# Patient Record
Sex: Female | Born: 1970 | Race: Black or African American | Hispanic: Yes | Smoking: Never smoker
Health system: Southern US, Community
[De-identification: ages and names within clinical notes are randomized; demographics above are authoritative.]

## PROBLEM LIST (undated history)

## (undated) DIAGNOSIS — O99345 Other mental disorders complicating the puerperium: Secondary | ICD-10-CM

## (undated) DIAGNOSIS — R002 Palpitations: Secondary | ICD-10-CM

## (undated) DIAGNOSIS — IMO0002 Reserved for concepts with insufficient information to code with codable children: Secondary | ICD-10-CM

## (undated) DIAGNOSIS — M419 Scoliosis, unspecified: Secondary | ICD-10-CM

## (undated) DIAGNOSIS — M1990 Unspecified osteoarthritis, unspecified site: Secondary | ICD-10-CM

## (undated) DIAGNOSIS — B3731 Acute candidiasis of vulva and vagina: Secondary | ICD-10-CM

## (undated) DIAGNOSIS — Z9189 Other specified personal risk factors, not elsewhere classified: Secondary | ICD-10-CM

## (undated) DIAGNOSIS — G43909 Migraine, unspecified, not intractable, without status migrainosus: Secondary | ICD-10-CM

## (undated) DIAGNOSIS — F419 Anxiety disorder, unspecified: Secondary | ICD-10-CM

## (undated) DIAGNOSIS — N930 Postcoital and contact bleeding: Secondary | ICD-10-CM

## (undated) DIAGNOSIS — R102 Pelvic and perineal pain: Secondary | ICD-10-CM

## (undated) DIAGNOSIS — F53 Postpartum depression: Secondary | ICD-10-CM

## (undated) DIAGNOSIS — B373 Candidiasis of vulva and vagina: Secondary | ICD-10-CM

## (undated) HISTORY — DX: Postcoital and contact bleeding: N93.0

## (undated) HISTORY — DX: Candidiasis of vulva and vagina: B37.3

## (undated) HISTORY — DX: Unspecified osteoarthritis, unspecified site: M19.90

## (undated) HISTORY — DX: Migraine, unspecified, not intractable, without status migrainosus: G43.909

## (undated) HISTORY — DX: Other specified personal risk factors, not elsewhere classified: Z91.89

## (undated) HISTORY — DX: Pelvic and perineal pain: R10.2

## (undated) HISTORY — PX: COSMETIC SURGERY: SHX468

## (undated) HISTORY — DX: Anxiety disorder, unspecified: F41.9

## (undated) HISTORY — DX: Postpartum depression: F53.0

## (undated) HISTORY — DX: Palpitations: R00.2

## (undated) HISTORY — DX: Reserved for concepts with insufficient information to code with codable children: IMO0002

## (undated) HISTORY — DX: Acute candidiasis of vulva and vagina: B37.31

## (undated) HISTORY — PX: TUBAL LIGATION: SHX77

## (undated) HISTORY — DX: Scoliosis, unspecified: M41.9

## (undated) HISTORY — DX: Other mental disorders complicating the puerperium: O99.345

---

## 2002-12-20 DIAGNOSIS — IMO0002 Reserved for concepts with insufficient information to code with codable children: Secondary | ICD-10-CM

## 2002-12-20 DIAGNOSIS — R87619 Unspecified abnormal cytological findings in specimens from cervix uteri: Secondary | ICD-10-CM

## 2002-12-20 HISTORY — DX: Reserved for concepts with insufficient information to code with codable children: IMO0002

## 2002-12-20 HISTORY — DX: Unspecified abnormal cytological findings in specimens from cervix uteri: R87.619

## 2003-07-12 ENCOUNTER — Ambulatory Visit (HOSPITAL_COMMUNITY): Admission: RE | Admit: 2003-07-12 | Discharge: 2003-07-12 | Payer: Self-pay | Admitting: Family Medicine

## 2003-07-12 ENCOUNTER — Encounter: Payer: Self-pay | Admitting: Family Medicine

## 2003-10-22 ENCOUNTER — Ambulatory Visit (HOSPITAL_COMMUNITY): Admission: RE | Admit: 2003-10-22 | Discharge: 2003-10-22 | Payer: Self-pay

## 2005-04-14 ENCOUNTER — Ambulatory Visit (HOSPITAL_COMMUNITY): Admission: AD | Admit: 2005-04-14 | Discharge: 2005-04-14 | Payer: Self-pay | Admitting: Obstetrics and Gynecology

## 2006-08-30 ENCOUNTER — Inpatient Hospital Stay (HOSPITAL_COMMUNITY): Admission: AD | Admit: 2006-08-30 | Discharge: 2006-08-30 | Payer: Self-pay | Admitting: Obstetrics and Gynecology

## 2006-09-30 ENCOUNTER — Encounter (INDEPENDENT_AMBULATORY_CARE_PROVIDER_SITE_OTHER): Payer: Self-pay | Admitting: Specialist

## 2006-09-30 ENCOUNTER — Inpatient Hospital Stay (HOSPITAL_COMMUNITY): Admission: AD | Admit: 2006-09-30 | Discharge: 2006-10-03 | Payer: Self-pay | Admitting: Obstetrics and Gynecology

## 2008-06-30 ENCOUNTER — Emergency Department (HOSPITAL_COMMUNITY): Admission: EM | Admit: 2008-06-30 | Discharge: 2008-06-30 | Payer: Self-pay | Admitting: Family Medicine

## 2008-11-15 ENCOUNTER — Encounter: Admission: RE | Admit: 2008-11-15 | Discharge: 2008-11-15 | Payer: Self-pay | Admitting: Family Medicine

## 2008-11-27 ENCOUNTER — Encounter: Admission: RE | Admit: 2008-11-27 | Discharge: 2008-11-27 | Payer: Self-pay | Admitting: Family Medicine

## 2011-05-07 NOTE — Discharge Summary (Signed)
NAMEGLYNN, FREAS              ACCOUNT NO.:  [id number]   MEDICAL RECORD NO.:  [id number]          PATIENT TYPE:  INP   LOCATION:  9139                          FACILITY:  [facility]   PHYSICIAN:  Crist Fat. Rivard, M.D. DATE OF BIRTH:  [birth year]   DATE OF ADMISSION:  09/30/2006  DATE OF DISCHARGE:  10/03/2006                                 DISCHARGE SUMMARY   ADMISSION DIAGNOSES:  1. Intrauterine pregnancy at 38 weeks.  2. Desires repeat cesarean section and tubal sterilization.   DISCHARGE DIAGNOSES:  1. Intrauterine pregnancy at 38 weeks.  2. Desires repeat cesarean section and tubal sterilization.  3. Pelvic adhesions.   HOSPITAL COURSE:  Ms. [name] is a 40 year old, G3, P2-0-0-2 who presented at  62 weeks' for scheduled repeat cesarean section and bilateral tubal  sterilization.  Her pregnancy had been remarkable for transfer of care at 30  weeks, history of previous cesarean section x2 with desire for repeat,  desires tubal sterilization, history of postpartum depression, history of  UTIs in the past, positive Group B Streptococcus.   On the day of admission, the patient was taken to the operating room where  Dr. Jaymes Graff performed a repeat low transverse cesarean section with  tubal sterilization.  Findings were a viable female, weight 8 pounds 7 ounces,  Apgar's 9 and 9.  The infant was taken to the full-term nursery.  The  patient was taken to recovery in good condition.  By postop day #1, the  patient was doing well.  Her hemoglobin was 10.2 down from 11.5  preoperatively.  White blood cell count was 9.4 and platelet count was 302.  Her physical exam was within normal limits.  Her incision was clean, dry and  intact with subcuticular sutures.  The rest of her hospital stay was  essentially uncomplicated.  She was breast-feeding and that was going well.  She was up ad lib and tolerating a regular diet.  She was deemed to receive  full benefit of her hospital stay and  was discharged home.   SPECIAL INSTRUCTIONS:  Discharge instructions per [facility]  Handout.   DISCHARGE MEDICATIONS:  1. Motrin 600 mg p.o. q.6 h. p.r.n. pain.  2. Percocet 5/325 one to two p.o. q.3-4 h. p.r.n. pain.  3. Prenatal vitamin one p.o. daily.   FOLLOW UP:  Discharge followup will occur in 6 weeks at [facility]  OB/GYN or p.r.n.      Renaldo Reel Emilee Hero, C.N.M.      Crist Fat Rivard, M.D.  Electronically Signed    VLL/MEDQ  D:  10/03/2006  T:  10/04/2006  Job:  [id number]

## 2011-05-07 NOTE — H&P (Signed)
NAMEGILLIAN, [name]              ACCOUNT NO.:  [id number]   MEDICAL RECORD NO.:  [id number]          PATIENT TYPE:  INP   LOCATION:  9139                          [facility]  [facility]   PHYSICIAN:  Naima A. Dillard, M.D. DATE OF BIRTH:  [birth year]   DATE OF ADMISSION:  09/30/2006  DATE OF DISCHARGE:                                HISTORY & PHYSICAL   CHIEF COMPLAINT:  Pregnancy at term, desires repeat cesarean section.  The  patient is a 40 year old gravida 3, para 2, whose last menstrual period was  January 06, 2006 and estimated due date is October 13, 2006.  Is presenting  for repeat cesarean section and tubal ligation.  She denies any contraction,  denies any vaginal bleeding or rupture of membranes, has positive fetal  activity.   REVIEW OF SYSTEMS:  The patient denies having any heart palpitations, any  history of irritable bowel syndrome.  gyn history significant for a history  of abnormal Pap smear.  Endocrine is unremarkable for diabetes or high blood  pressure.   Prenatal course was at [facility].  She is a transfer for her care at 30 weeks.   MEDICATIONS:  Prenatal vitamins.   ALLERGIES:  No known drug allergies.   PAST OB HISTORY:  Previous cesarean section x 2, one in September 2003 at 39  weeks for nonreassuring fetal heart tones with failure to progress and one  in June of 2006 secondary to being breech.   GYN HISTORY:  Patient has a history of abnormal Pap smear in 2004.  Denies  history of any sexually transmitted diseases.   PAST MEDICAL HISTORY:  Significant for postpartum depression.   PAST SURGICAL HISTORY:  Significant for cesarean section x 2.   FAMILY HISTORY:  The patient is married.  She has a half sister with a  history of Turner Syndrome.  Maternal aunt with hypertension.   PRENATAL LABS:  She is O positive, antibody negative.  Last hemoglobin was  12.12, platelet count 315,000.  She is rubella immune, hepatitis B surface  antigen is negative. Syphilis  is nonreactive.  HIV negative. Gonorrhea and  Chlamydia both negative.  One-hour Glucola was abnormal at 160 but three  hour Glucola was within normal limits.   SOCIAL HISTORY:  The patient denies any tobacco or alcohol use.   PHYSICAL EXAMINATION:  VITAL SIGNS:  Patient weighs 158 pounds, fundal  height is 39.  She is 37 and 3/7 weeks.  Fetal heart tones 150.  Blood  pressure 110/60.  Patient is in no apparent distress.  HEART:  Regular rate and rhythm.  LUNGS:  Clear to auscultation bilaterally.  ABDOMEN:  Gravid, soft, nontender.  NECK:  Thyroid is not enlarged.  vulvar vaginal exam was within normal limits.  On last exam the patient was  closed, thick and -3.  EXTREMITIES:  No cyanosis, clubbing, edema or calf tenderness.   ASSESSMENT:  Pregnancy at 38 weeks, desires repeat cesarean section and  tubal ligation.   PLAN:  Repeat cesarean section and tubal ligation.  Patient understands the  risks including not limited to bleeding, infection, damage  to internal  organs such as bowel, bladder  and major blood vessels      Naima A. Normand Sloop, M.D.  Electronically Signed     NAD/MEDQ  D:  09/29/2006  T:  09/30/2006  Job:  [id number]

## 2011-05-07 NOTE — Op Note (Signed)
[name], [name]              ACCOUNT NO.:  [id number]   MEDICAL RECORD NO.:  [id number]          PATIENT TYPE:  INP   LOCATION:  9139                          [facility]ILITY[facility]  [facility]   PHYSICIAN:  Naima A. Dillard, M.D. DATE OF BIRTH:  26-Dec-1970   DATE OF PROCEDURE:  09/30/2006  DATE OF DISCHARGE:                                 OPERATIVE REPORT   PREOPERATIVE DIAGNOSES:  1. Term, desires repeat cesarean section.  2. Multiparity, desires tubal ligation.   POSTOPERATIVE DIAGNOSES:  1. Term, desires repeat cesarean section.  2. Multiparity, desires tubal ligation.  3. Abdominopelvic adhesions.   OPERATION/PROCEDURE:  1. Repeat cesarean section.  2. Bilateral tubal ligation.  3. Lysis of adhesions.   SURGEON:  Naima A. Normand Sloop, M.D.   ASSISTANT:  Elby Showers. Williams, C.N.M.   ANESTHESIA:  Spinal.   SPECIMENS:  Placenta to labor and delivery for disposal.   ESTIMATED BLOOD LOSS:  800 mL.   IV FLUIDS:  3400 mL crystalloid.   URINARY OUTPUT:  150 mL clear urine.   COMPLICATIONS:  None.   FINDINGS:  Female infant, vertex presentation.  Clear fluid.  Nuchal cord x1  was easily reduced.  Apgars 9 and 9.  Weight was 8 pounds 5 ounces.   RISKS:  The risks could be not limited to bleeding, infection, damage to  internal organs such as bowel, bladder, major blood vessels, and even a  failure in the tubal ligation being 1 on 200 which could 50% could be result  in ectopic pregnancy.   DESCRIPTION OF PROCEDURE:  The patient was taken to the operating room and  given spinal anesthesia, placed in the dorsal supine position with a left  lateral tilt.  Pfannenstiel skin incision was made along her previous  incision and carried down to the fascia.  The fascia was incised in the  midline, extended bilaterally.  Kochers x2 were placed in the superior  aspect of the fascia which was dissected off  both sharply and bluntly.  The  inferior aspect of the fascia was dissected off the rectus  muscle in a  similar fashion.  The rectus muscle was separated in the midline.  What  appeared to be peritoneum was identified, tented up and entered sharply.  There were bladder and uterine adhesions which were dissected and it was  very difficult to make the bladder flap.  So I made a lower transverse  uterine incision just above the bladder and extended bilaterally using  bandage scissors.  The sac was entered bluntly and clear fluid was noted.  Tried to deliver the infant's head several times and could not use the  vacuum in the right position.  There were three pulls and three pop-offs in  the green safe zone. I cut the patient's left rectus muscle and we still  applied pressure on the head, finally delivered.  Mouth and nose were bulb  suctioned.  Nuchal cord was easily reduced.  Body was delivered without  difficulty.  Cord was clamped and cut.  The infant was handed to the waiting  pediatricians.  The placenta was manually  delivered.  The uterus was cleared  of all clot and debris.  Uterine incision was repaired with 0 Vicryl.  A  second layer of 0 Vicryl was used to obtain hemostasis.  There was still  some bleeding on the left side of the uterus.  This was made hemostatic with  figure-of-eight stitch.  The uterus was adherent to the anterior abdominal  wall.  The adhesions were then lysed both sharply and bluntly and the  patient's left fallopian tube was found, ligated with 2-0 plain and a  portion was excised and sent to pathology.  Hemostasis was assured.  The  patient's right fallopian tube was found, grasped with a Babcock clamp,  ligated with 2-0 plain and excised.  Hemostasis was assured. Tubes were  returned back Any areas that were bleeding from dissection were made  hemostatic using Bovie cautery.  The muscle that was cut was put together  using 0 Vicryl.  Any bleeding areas were made hemostatic with 0 Vicryl.  There were some just oozing areas from where I lysed the  adhesions and most  were made hemostatic using Bovie cautery, but I also used Surgicel just over  the uterus and underneath the rectus muscle.  There were no other bleeding  areas noted.  Irrigation was then done again.  The fascia was closed with 0  Vicryl on a running fashion.  The skin was closed with 3-0 Monocryl in  subcuticular fashion.  Sponge, lap and needle counts were correct.  The  patient went to the recovery room in stable condition.      Naima A. Normand Sloop, M.D.  Electronically Signed     NAD/MEDQ  D:  09/30/2006  T:  10/02/2006  Job:  [id number]

## 2011-09-16 LAB — POCT I-STAT, CHEM 8
BUN: 8
Calcium, Ion: 1.24
Chloride: 107
Creatinine, Ser: 1
Glucose, Bld: 83
HCT: 37
Hemoglobin: 12.6
Potassium: 3.8
Sodium: 140
TCO2: 24

## 2011-09-16 LAB — RAPID URINE DRUG SCREEN, HOSP PERFORMED
Amphetamines: NOT DETECTED
Barbiturates: NOT DETECTED
Benzodiazepines: NOT DETECTED
Cocaine: NOT DETECTED
Opiates: NOT DETECTED
Tetrahydrocannabinol: NOT DETECTED

## 2011-09-16 LAB — POCT PREGNANCY, URINE
Operator id: 284251
Preg Test, Ur: NEGATIVE

## 2011-09-16 LAB — TSH: TSH: 1.725

## 2012-08-08 ENCOUNTER — Encounter: Payer: Self-pay | Admitting: Obstetrics and Gynecology

## 2012-08-22 ENCOUNTER — Ambulatory Visit: Payer: Self-pay | Admitting: Obstetrics and Gynecology

## 2012-09-05 ENCOUNTER — Ambulatory Visit (INDEPENDENT_AMBULATORY_CARE_PROVIDER_SITE_OTHER): Payer: PRIVATE HEALTH INSURANCE | Admitting: Obstetrics and Gynecology

## 2012-09-05 VITALS — BP 110/60 | Ht 63.0 in | Wt 137.0 lb

## 2012-09-05 DIAGNOSIS — D259 Leiomyoma of uterus, unspecified: Secondary | ICD-10-CM

## 2012-09-05 DIAGNOSIS — D219 Benign neoplasm of connective and other soft tissue, unspecified: Secondary | ICD-10-CM

## 2012-09-05 NOTE — Progress Notes (Signed)
Pt here to f/u on fibroids no complaints at this time pt just wants to make sure everything is ok.  She occasionally has cramping with mense or with IC BP 110/60  Ht 5\' 3"  (1.6 m)  Wt 137 lb (62.143 kg)  BMI 24.27 kg/m2  LMP 08/31/2012 Physical Examination: General appearance - alert, well appearing, and in no distress Heart - normal rate and regular rhythm Abdomen - soft, nontender, nondistended, no masses or organomegaly Pelvic - normal external genitalia, vulva, vagina, cervix, uterus and adnexa Fibroid uterus Pt stable and desires observation

## 2012-09-05 NOTE — Patient Instructions (Signed)
Fibroids You have been diagnosed as having a fibroid. Fibroids are smooth muscle lumps (tumors) which can occur any place in a woman's body. They are usually in the womb (uterus). The most common problem (symptom) of fibroids is bleeding. Over time this may cause low red blood cells (anemia). Other symptoms include feelings of pressure and pain in the pelvis. The diagnosis (learning what is wrong) of fibroids is made by physical exam. Sometimes tests such as an ultrasound are used. This is helpful when fibroids are felt around the ovaries and to look for tumors. TREATMENT   Most fibroids do not need surgical or medical treatment. Sometimes a tissue sample (biopsy) of the lining of the uterus is done to rule out cancer. If there is no cancer and only a small amount of bleeding, the problem can be watched.   Hormonal treatment can improve the problem.   When surgery is needed, it can consist of removing the fibroid. Vaginal birth may not be possible after the removal of fibroids. This depends on where they are and the extent of surgery. When pregnancy occurs with fibroids it is usually normal.   Your caregiver can help decide which treatments are best for you.  HOME CARE INSTRUCTIONS   Do not use aspirin as this may increase bleeding problems.   If your periods (menses) are heavy, record the number of pads or tampons used per month. Bring this information to your caregiver. This can help them determine the best treatment for you.  SEEK IMMEDIATE MEDICAL CARE IF:  You have pelvic pain or cramps not controlled with medications, or experience a sudden increase in pain.   You have an increase of pelvic bleeding between and during menses.   You feel lightheaded or have fainting spells.   You develop worsening belly (abdominal) pain.  Document Released: 12/03/2000 Document Revised: 11/25/2011 Document Reviewed: 07/25/2008 Exit[employer] Patient Information 2012 [employer].    

## 2012-10-26 ENCOUNTER — Encounter: Payer: Self-pay | Admitting: Obstetrics and Gynecology

## 2012-10-26 ENCOUNTER — Ambulatory Visit (INDEPENDENT_AMBULATORY_CARE_PROVIDER_SITE_OTHER): Payer: PRIVATE HEALTH INSURANCE | Admitting: Obstetrics and Gynecology

## 2012-10-26 VITALS — BP 110/60 | Ht 63.0 in | Wt 137.0 lb

## 2012-10-26 DIAGNOSIS — Z124 Encounter for screening for malignant neoplasm of cervix: Secondary | ICD-10-CM

## 2012-10-26 NOTE — Progress Notes (Signed)
Last Pap: 04/2011 WNL: Yes Regular Periods:yes Contraception: n/a  Monthly Breast exam:yes Tetanus<83yrs:yes Nl.Bladder Function:yes Daily BMs:yes Healthy Diet:yes Calcium:yes Mammogram:no Date of Mammogram: 2009 wnl per pt Exercise:yes Have often Exercise: occ Seatbelt: yes Abuse at home: no Stressful work:no Sigmoid-colonoscopy: n/a Bone Density: No PCP: DR.[place]amak  Change in PMH: no change Change in FMH:no change BP 110/60  Ht 5\' 3"  (1.6 m)  Wt 137 lb (62.143 kg)  BMI 24.27 kg/m2  LMP 10/22/2012 Pt with complaints:no Physical Examination: General appearance - alert, well appearing, and in no distress Mental status - normal mood, behavior, speech, dress, motor activity, and thought processes Neck - supple, no significant adenopathy,  thyroid exam: thyroid is normal in size without nodules or tenderness Chest - clear to auscultation, no wheezes, rales or rhonchi, symmetric air entry Heart - normal rate and regular rhythm Abdomen - soft, nontender, nondistended, no masses or organomegaly Breasts - breasts appear normal, no suspicious masses, no skin or nipple changes or axillary nodes Pelvic - normal external genitalia, vulva, vagina, cervix, uterus and adnexa Rectal - rectal exam not indicated Back exam - full range of motion, no tenderness, palpable spasm or pain on motion Neurological - alert, oriented, normal speech, no focal findings or movement disorder noted Musculoskeletal - no joint tenderness, deformity or swelling Extremities - no edema, redness or tenderness in the calves or thighs Skin - normal coloration and turgor, no rashes, no suspicious skin lesions noted Routine exam Pap sent yes Mammogram due yes in 12/2012.  The pt will call and scheudle BTL used for contraception RT 1 yr

## 2012-10-30 LAB — PAP IG W/ RFLX HPV ASCU

## 2014-01-22 ENCOUNTER — Other Ambulatory Visit: Payer: Self-pay

## 2014-01-22 DIAGNOSIS — Z1231 Encounter for screening mammogram for malignant neoplasm of breast: Secondary | ICD-10-CM

## 2014-02-07 ENCOUNTER — Ambulatory Visit
Admission: RE | Admit: 2014-02-07 | Discharge: 2014-02-07 | Disposition: A | Discharge status: Home / Self Care | Payer: 59 | Source: Ambulatory Visit

## 2014-02-07 DIAGNOSIS — Z1231 Encounter for screening mammogram for malignant neoplasm of breast: Secondary | ICD-10-CM

## 2014-02-13 ENCOUNTER — Other Ambulatory Visit: Payer: Self-pay | Admitting: Obstetrics and Gynecology

## 2014-02-13 DIAGNOSIS — R928 Other abnormal and inconclusive findings on diagnostic imaging of breast: Secondary | ICD-10-CM

## 2014-02-14 ENCOUNTER — Ambulatory Visit
Admission: RE | Admit: 2014-02-14 | Discharge: 2014-02-14 | Disposition: A | Discharge status: Home / Self Care | Payer: 59 | Source: Ambulatory Visit | Attending: Obstetrics and Gynecology | Admitting: Obstetrics and Gynecology

## 2014-02-14 ENCOUNTER — Other Ambulatory Visit: Payer: Self-pay | Admitting: Obstetrics and Gynecology

## 2014-02-14 DIAGNOSIS — R928 Other abnormal and inconclusive findings on diagnostic imaging of breast: Secondary | ICD-10-CM

## 2014-02-20 ENCOUNTER — Other Ambulatory Visit: Payer: Self-pay | Admitting: Obstetrics and Gynecology

## 2014-02-20 ENCOUNTER — Ambulatory Visit
Admission: RE | Admit: 2014-02-20 | Discharge: 2014-02-20 | Disposition: A | Discharge status: Home / Self Care | Payer: 59 | Source: Ambulatory Visit | Attending: Obstetrics and Gynecology | Admitting: Obstetrics and Gynecology

## 2014-02-20 DIAGNOSIS — N6310 Unspecified lump in the right breast, unspecified quadrant: Secondary | ICD-10-CM

## 2014-02-20 DIAGNOSIS — R928 Other abnormal and inconclusive findings on diagnostic imaging of breast: Secondary | ICD-10-CM

## 2014-02-27 ENCOUNTER — Other Ambulatory Visit: Payer: 59

## 2014-08-29 ENCOUNTER — Ambulatory Visit (INDEPENDENT_AMBULATORY_CARE_PROVIDER_SITE_OTHER): Payer: 59 | Admitting: Family Medicine

## 2014-08-29 ENCOUNTER — Encounter: Payer: Self-pay | Admitting: Family Medicine

## 2014-08-29 VITALS — BP 94/60 | HR 105 | Temp 98.3°F | Ht 62.75 in | Wt 134.0 lb

## 2014-08-29 DIAGNOSIS — N912 Amenorrhea, unspecified: Secondary | ICD-10-CM | POA: Insufficient documentation

## 2014-08-29 DIAGNOSIS — Z7689 Persons encountering health services in other specified circumstances: Secondary | ICD-10-CM

## 2014-08-29 DIAGNOSIS — G43019 Migraine without aura, intractable, without status migrainosus: Secondary | ICD-10-CM

## 2014-08-29 DIAGNOSIS — G43909 Migraine, unspecified, not intractable, without status migrainosus: Secondary | ICD-10-CM | POA: Insufficient documentation

## 2014-08-29 DIAGNOSIS — Z7189 Other specified counseling: Secondary | ICD-10-CM

## 2014-08-29 LAB — POCT URINE PREGNANCY: Preg Test, Ur: NEGATIVE

## 2014-08-29 NOTE — Progress Notes (Signed)
No chief complaint on file.   HPI:  [name] is here to establish care. Used to go [facility] years ago - used to see Dr. Georgiann Cocker. Last PCP and physical:  [facility] [place] [facility]  Has the following chronic problems and concerns today:  Patient Active Problem List   Diagnosis Date Noted  . Amenorrhea 08/29/2014  . Migraine 08/29/2014   Amenorrhea: -June 2015 -felt very tired yesterday -felt nauseous yesterday, had hot flash last week -did a pregnancy test 1 week ago -mother went through menopause in late 52s, a little hirsutism her whole life -seeing gyn for this next month  Migraines: -since she was a teenager -usually about 1 time per month, splitting headaches that last about 3 days ago -usually take OTC medications for this, never tried triptan -unchanged  ROS negative for unless reported above: fevers, unintentional weight loss, hearing or vision loss, chest pain, palpitations, struggling to breath, hemoptysis, melena, hematochezia, hematuria, falls, loc, si, thoughts of self harm  Past Medical History  Diagnosis Date  . Yeast vaginitis     recurrent   . Dyspareunia   . Post - coital bleeding   . Pelvic pain in female   . Post partum depression   . Palpitations   . Abnormal Pap smear 2004  . Migraines   . History of fainting spells of unknown cause     Family History  Problem Relation Age of Onset  . Hypertension Maternal Uncle   . Heart disease Mother   . Hypertension Mother     History   Social History  . Marital Status: Married    Spouse Name: N/A    Number of Children: N/A  . Years of Education: N/A   Social History Main Topics  . Smoking status: Never Smoker   . Smokeless tobacco: None  . Alcohol Use: No  . Drug Use: No  . Sexual Activity: None     Comment: BTL   Other Topics Concern  . None   Social History Narrative   Work or School: Sport and exercise psychologist, [employer] Situation: lives with husband and 3  kids (43, 45 and 61 yo in 2015)      Spiritual Beliefs: Christian      Lifestyle: no regular CV exercise; fair             No current outpatient prescriptions on file.  EXAM:  Filed Vitals:   08/29/14 1606  BP: 94/60  Pulse: 105  Temp: 98.3 F (36.8 C)    Body mass index is 23.92 kg/(m^2).  GENERAL: vitals reviewed and listed above, alert, oriented, appears well hydrated and in no acute distress  HEENT: atraumatic, conjunttiva clear, no obvious abnormalities on inspection of external nose and ears  NECK: no obvious masses on inspection  LUNGS: clear to auscultation bilaterally, no wheezes, rales or rhonchi, good air movement  CV: HRRR, no peripheral edema  MS: moves all extremities without noticeable abnormality  PSYCH: pleasant and cooperative, no obvious depression or anxiety  ASSESSMENT AND PLAN:  Discussed the following assessment and plan:  Amenorrhea - Plan: TSH, POCT urine pregnancy  Intractable migraine without aura and without status migrainosus  Encounter to establish care - Plan: Basic metabolic panel, Lipid Panel  -We reviewed the PMH, PSH, FH, SH, Meds and Allergies. -We provided refills for any medications we will prescribe as needed. -We addressed current concerns per orders and patient instructions. -We have asked for  records for pertinent exams, studies, vaccines and notes from previous providers. -We have advised patient to follow up per instructions below. -NON-FASTING labs -offered flu shot, declined -follow up in 3 months, may consider triptan for migraines as she has never tried this  -Patient advised to return or notify a doctor immediately if symptoms worsen or persist or new concerns arise.  Patient Instructions  -see gynecologist as scheduled about the periods  -We have ordered labs or studies at this visit. It can take up to 1-2 weeks for results and processing. We will contact you with instructions IF your results are abnormal.  Normal results will be released to your [facility]. If you have not heard from [place] or can not find your results in [facility] in 2 weeks please contact our office.  -PLEASE SIGN UP FOR MYCHART TODAY   We recommend the following healthy lifestyle measures: - eat a healthy diet consisting of lots of vegetables, fruits, beans, nuts, seeds, healthy meats such as white chicken and fish and whole grains.  - avoid fried foods, fast food, processed foods, sodas, red meet and other fattening foods.  - get a least 150 minutes of aerobic exercise per week.   Follow up in: 3 months             KIM, HANNAH R.

## 2014-08-29 NOTE — Patient Instructions (Signed)
-  see gynecologist as scheduled about the periods  -We have ordered labs or studies at this visit. It can take up to 1-2 weeks for results and processing. We will contact you with instructions IF your results are abnormal. Normal results will be released to your [facility]. If you have not heard from [place] or can not find your results in [facility] in 2 weeks please contact our office.  -PLEASE SIGN UP FOR MYCHART TODAY   We recommend the following healthy lifestyle measures: - eat a healthy diet consisting of lots of vegetables, fruits, beans, nuts, seeds, healthy meats such as white chicken and fish and whole grains.  - avoid fried foods, fast food, processed foods, sodas, red meet and other fattening foods.  - get a least 150 minutes of aerobic exercise per week.   Follow up in: 3 months

## 2014-08-29 NOTE — Progress Notes (Signed)
Pre visit review using our clinic review tool, if applicable. No additional management support is needed unless otherwise documented below in the visit note. 

## 2014-08-30 LAB — LIPID PANEL
Cholesterol: 151 mg/dL (ref 0–200)
HDL: 44.9 mg/dL (ref >39.00)
NonHDL: 106.1
Total CHOL/HDL Ratio: 3
Triglycerides: 260 mg/dL — ABNORMAL HIGH (ref 0.0–149.0)
VLDL: 52 mg/dL — ABNORMAL HIGH (ref 0.0–40.0)

## 2014-08-30 LAB — LDL CHOLESTEROL, DIRECT: Direct LDL: 74.4 mg/dL

## 2014-08-30 LAB — BASIC METABOLIC PANEL
BUN: 10 mg/dL (ref 6–23)
CO2: 27 mEq/L (ref 19–32)
Calcium: 9.4 mg/dL (ref 8.4–10.5)
Chloride: 104 mEq/L (ref 96–112)
Creatinine, Ser: 0.8 mg/dL (ref 0.4–1.2)
GFR: 98.5 mL/min (ref >60.00)
Glucose, Bld: 80 mg/dL (ref 70–99)
Potassium: 4 mEq/L (ref 3.5–5.1)
Sodium: 138 mEq/L (ref 135–145)

## 2014-08-30 LAB — TSH: TSH: 0.48 u[IU]/mL (ref 0.35–4.50)

## 2014-09-16 ENCOUNTER — Other Ambulatory Visit: Payer: Self-pay

## 2014-10-21 ENCOUNTER — Encounter: Payer: Self-pay | Admitting: Family Medicine

## 2014-10-21 ENCOUNTER — Ambulatory Visit (INDEPENDENT_AMBULATORY_CARE_PROVIDER_SITE_OTHER): Payer: 59 | Admitting: Family Medicine

## 2014-10-21 VITALS — BP 100/58 | HR 105 | Temp 98.6°F | Ht 62.75 in | Wt 135.1 lb

## 2014-10-21 DIAGNOSIS — L42 Pityriasis rosea: Secondary | ICD-10-CM

## 2014-10-21 MED ORDER — TRIAMCINOLONE ACETONIDE 0.1 % EX CREA: 1.0000 "application " | TOPICAL_CREAM | Freq: Two times a day (BID) | CUTANEOUS | Status: DC

## 2014-10-21 NOTE — Progress Notes (Signed)
No chief complaint on file.   HPI:  Acute visit for:  Rash: -started 5 days ago -on trunk and upper arms arms -slightly itchy   ROS: See pertinent positives and negatives per HPI.  Past Medical History  Diagnosis Date  . Yeast vaginitis     recurrent   . Dyspareunia   . Post - coital bleeding   . Pelvic pain in female   . Post partum depression   . Palpitations   . Abnormal Pap smear 2004  . Migraines   . History of fainting spells of unknown cause     Past Surgical History  Procedure Laterality Date  . Tubal ligation    . Cesarean section      Family History  Problem Relation Age of Onset  . Hypertension Maternal Uncle   . Heart disease Mother   . Hypertension Mother     History   Social History  . Marital Status: Married    Spouse Name: N/A    Number of Children: N/A  . Years of Education: N/A   Social History Main Topics  . Smoking status: Never Smoker   . Smokeless tobacco: None  . Alcohol Use: No  . Drug Use: No  . Sexual Activity: None     Comment: BTL   Other Topics Concern  . None   Social History Narrative   Work or School: Sport and exercise psychologist, [employer] Situation: lives with husband and 3 kids (42, 5 and 68 yo in 2015)      Spiritual Beliefs: Christian      Lifestyle: no regular CV exercise; fair             Current outpatient prescriptions: norethindrone-ethinyl estradiol (BALZIVA) 0.4-35 MG-MCG tablet, Take 1 tablet by mouth daily., Disp: , Rfl: ;  terbinafine (LAMISIL) 1 % cream, Apply 1 application topically 2 (two) times daily., Disp: , Rfl: ;  triamcinolone cream (KENALOG) 0.1 %, Apply 1 application topically 2 (two) times daily., Disp: 30 g, Rfl: 0  EXAM:  Filed Vitals:   10/21/14 0901  BP: 100/58  Pulse: 105  Temp: 98.6 F (37 C)    Body mass index is 24.12 kg/(m^2).  GENERAL: vitals reviewed and listed above, alert, oriented, appears well hydrated and in no acute distress  HEENT: atraumatic,  conjunttiva clear, no obvious abnormalities on inspection of external nose and ears  NECK: no obvious masses on inspection  SKIN: herald patch on R buttock, scattered hyperpigmented macule with fine sale on borders on trunk, arms, upper legs  MS: moves all extremities without noticeable abnormality  PSYCH: pleasant and cooperative, no obvious depression or anxiety  ASSESSMENT AND PLAN:  Discussed the following assessment and plan:  Pityriasis rosea - Plan: triamcinolone cream (KENALOG) 0.1 %  -Patient advised to return or notify a doctor immediately if symptoms worsen or persist or new concerns arise.  Patient Instructions  Pityriasis Rosea Pityriasis rosea is a rash which is probably caused by a virus. It generally starts as a scaly, red patch on the trunk (the area of the body that a t-shirt would cover) but does not appear on sun exposed areas. The rash is usually preceded by an initial larger spot called the "herald patch" a week or more before the rest of the rash appears. Generally within one to two days the rash appears rapidly on the trunk, upper arms, and sometimes the upper legs. The rash usually appears as flat, oval patches of  scaly pink color. The rash can also be raised and one is able to feel it with a finger. The rash can also be finely crinkled and may slough off leaving a ring of scale around the spot. Sometimes a mild sore throat is present with the rash. It usually affects children and young adults in the spring and autumn. Women are more frequently affected than men. TREATMENT  Pityriasis rosea is a self-limited condition. This means it goes away within 4 to 8 weeks without treatment. The spots may persist for several months, especially in darker-colored skin after the rash has resolved and healed. Benadryl and steroid creams may be used if itching is a problem. SEEK MEDICAL CARE IF:   Your rash does not go away or persists longer than three months.  You develop fever  and joint pain.  You develop severe headache and confusion.  You develop breathing difficulty, vomiting and/or extreme weakness. Document Released: 01/12/2002 Document Revised: 02/28/2012 Document Reviewed: 01/31/2009 [facility] Patient Information 2015 [place], [place]. This information is not intended to replace advice given to you by your health care provider. Make sure you discuss any questions you have with your health care provider.      Colin Benton R.

## 2014-10-21 NOTE — Patient Instructions (Signed)
Pityriasis Rosea  Pityriasis rosea is a rash which is probably caused by a virus. It generally starts as a scaly, red patch on the trunk (the area of the body that a t-shirt would cover) but does not appear on sun exposed areas. The rash is usually preceded by an initial larger spot called the "herald patch" a week or more before the rest of the rash appears. Generally within one to two days the rash appears rapidly on the trunk, upper arms, and sometimes the upper legs. The rash usually appears as flat, oval patches of scaly pink color. The rash can also be raised and one is able to feel it with a finger. The rash can also be finely crinkled and may slough off leaving a ring of scale around the spot. Sometimes a mild sore throat is present with the rash. It usually affects children and young adults in the spring and autumn. Women are more frequently affected than men.  TREATMENT   Pityriasis rosea is a self-limited condition. This means it goes away within 4 to 8 weeks without treatment. The spots may persist for several months, especially in darker-colored skin after the rash has resolved and healed. Benadryl and steroid creams may be used if itching is a problem.  SEEK MEDICAL CARE IF:   · Your rash does not go away or persists longer than three months.  · You develop fever and joint pain.  · You develop severe headache and confusion.  · You develop breathing difficulty, vomiting and/or extreme weakness.  Document Released: 01/12/2002 Document Revised: 02/28/2012 Document Reviewed: 01/31/2009  [employer]® Patient Information ©2015 [employer]. This information is not intended to replace advice given to you by your health care provider. Make sure you discuss any questions you have with your health care provider.

## 2014-10-21 NOTE — Progress Notes (Signed)
Pre visit review using our clinic review tool, if applicable. No additional management support is needed unless otherwise documented below in the visit note. 

## 2014-10-23 ENCOUNTER — Encounter: Payer: Self-pay | Admitting: Family Medicine

## 2014-10-23 ENCOUNTER — Telehealth: Payer: Self-pay | Admitting: Family Medicine

## 2014-10-23 NOTE — Telephone Encounter (Signed)
Lm for pt to call back - tried all of her phone numbers. [phone] (home) [phone] (work)

## 2014-10-23 NOTE — Telephone Encounter (Signed)
Pt would like to know if dr Maudie Mercury will rx her acyclovir for the infection/virus breaking out. She states [place] is going out of the country [place] 4 weeks and wants to nbe cleared up. Pt is having more breakouts. [employer]

## 2014-12-11 ENCOUNTER — Ambulatory Visit: Payer: 59 | Admitting: Family Medicine

## 2015-01-21 ENCOUNTER — Other Ambulatory Visit: Payer: Self-pay

## 2015-01-21 DIAGNOSIS — Z1231 Encounter for screening mammogram for malignant neoplasm of breast: Secondary | ICD-10-CM

## 2015-02-12 ENCOUNTER — Ambulatory Visit: Payer: Self-pay

## 2015-02-13 ENCOUNTER — Ambulatory Visit: Payer: Self-pay

## 2015-02-14 ENCOUNTER — Ambulatory Visit
Admission: RE | Admit: 2015-02-14 | Discharge: 2015-02-14 | Disposition: A | Discharge status: Home / Self Care | Payer: 59 | Source: Ambulatory Visit

## 2015-02-14 DIAGNOSIS — Z1231 Encounter for screening mammogram for malignant neoplasm of breast: Secondary | ICD-10-CM

## 2015-05-01 ENCOUNTER — Encounter: Payer: Self-pay | Admitting: Family Medicine

## 2015-05-01 ENCOUNTER — Ambulatory Visit (INDEPENDENT_AMBULATORY_CARE_PROVIDER_SITE_OTHER): Payer: 59 | Admitting: Family Medicine

## 2015-05-01 VITALS — BP 102/68 | HR 94 | Temp 98.7°F | Ht 62.75 in | Wt 137.0 lb

## 2015-05-01 DIAGNOSIS — Z114 Encounter for screening for human immunodeficiency virus [HIV]: Secondary | ICD-10-CM

## 2015-05-01 NOTE — Progress Notes (Signed)
  HPI:  New sexual partner: -wants to check HIV as has had new sexual partner - he does not have any symptoms or disease to her knowledge -denies vaginal discharge, any symptoms -denies: multiple partners, high risks behaviors, drug use  ROS: See pertinent positives and negatives per HPI.  Past Medical History  Diagnosis Date  . Yeast vaginitis     recurrent   . Dyspareunia   . Post - coital bleeding   . Pelvic pain in female   . Post partum depression   . Palpitations   . Abnormal Pap smear 2004  . Migraines   . History of fainting spells of unknown cause     Past Surgical History  Procedure Laterality Date  . Tubal ligation    . Cesarean section      Family History  Problem Relation Age of Onset  . Hypertension Maternal Uncle   . Heart disease Mother   . Hypertension Mother     History   Social History  . Marital Status: Married    Spouse Name: N/A  . Number of Children: N/A  . Years of Education: N/A   Social History Main Topics  . Smoking status: Never Smoker   . Smokeless tobacco: Not on file  . Alcohol Use: No  . Drug Use: No  . Sexual Activity: Not on file     Comment: BTL   Other Topics Concern  . 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  norethindrone-ethinyl estradiol (BALZIVA) 0.4-35 MG-MCG tablet, Take 1 tablet by mouth daily., Disp: , Rfl:   EXAM:  Filed Vitals:   05/01/15 0909  BP: 102/68  Pulse: 94  Temp: 98.7 F (37.1 C)    Body mass index is 24.46 kg/(m^2).  GENERAL: vitals reviewed and listed above, alert, oriented, appears well hydrated and in no acute distress  HEENT: atraumatic, conjunttiva clear, no  obvious abnormalities on inspection of external nose and ears  NECK: no obvious masses on inspection  LUNGS: clear to auscultation bilaterally, no wheezes, rales or rhonchi, good air movement  CV: HRRR, no peripheral edema  MS: moves all extremities without noticeable abnormality  PSYCH: pleasant and cooperative, no obvious depression or anxiety  ASSESSMENT AND PLAN:  Discussed the following assessment and plan:  Encounter for screening for HIV - Plan: HIV antibody (with reflex)  -she declined other screenings -no symptoms -advised safe sexual practices -Patient advised to return or notify a doctor immediately if symptoms worsen or persist or new concerns arise.  There are no Patient Instructions on file for this visit.   Colin Benton R.

## 2015-05-01 NOTE — Progress Notes (Signed)
Pre visit review using our clinic review tool, if applicable. No additional management support is needed unless otherwise documented below in the visit note. 

## 2015-05-02 ENCOUNTER — Ambulatory Visit: Payer: 59 | Admitting: Family Medicine

## 2015-05-02 LAB — HIV ANTIBODY (ROUTINE TESTING W REFLEX): HIV 1&2 Ab, 4th Generation: NONREACTIVE

## 2015-08-21 ENCOUNTER — Ambulatory Visit: Payer: 59 | Admitting: Family Medicine

## 2016-07-16 ENCOUNTER — Encounter: Payer: Self-pay | Admitting: Family Medicine

## 2016-07-16 ENCOUNTER — Ambulatory Visit (INDEPENDENT_AMBULATORY_CARE_PROVIDER_SITE_OTHER): Payer: 59 | Admitting: Family Medicine

## 2016-07-16 VITALS — BP 118/68 | HR 84 | Temp 98.8°F | Ht 62.75 in | Wt 147.7 lb

## 2016-07-16 DIAGNOSIS — Z6826 Body mass index (BMI) 26.0-26.9, adult: Secondary | ICD-10-CM | POA: Diagnosis not present

## 2016-07-16 DIAGNOSIS — F411 Generalized anxiety disorder: Secondary | ICD-10-CM | POA: Diagnosis not present

## 2016-07-16 DIAGNOSIS — J029 Acute pharyngitis, unspecified: Secondary | ICD-10-CM | POA: Diagnosis not present

## 2016-07-16 LAB — POCT RAPID STREP A (OFFICE): Rapid Strep A Screen: NEGATIVE

## 2016-07-16 NOTE — Progress Notes (Signed)
HPI:  Acute visit for:  Sore throat: -x2 days -no fevers, nausea, vomiting, cough, SOB, known strep or other exposure -has not tried any treatments  GAD: -going thru separation from spouse -coping well, but does worry a lot and sometimes interferes with sleep, increased appetite for comfort foods -no SI, thoughts of self harm, panic, interference with function -feels safe, has good support -did some counseling but not recently  Weight gain: -from increased consumption comfort foods -knows is diet related  ROS: See pertinent positives and negatives per HPI.  Past Medical History:  Diagnosis Date  . Abnormal Pap smear 2004  . Dyspareunia   . History of fainting spells of unknown cause   . Migraines   . Palpitations   . Pelvic pain in female   . Post - coital bleeding   . Post partum depression   . Yeast vaginitis    recurrent     Past Surgical History:  Procedure Laterality Date  . CESAREAN SECTION    . TUBAL LIGATION      Family History  Problem Relation Age of Onset  . Hypertension Maternal Uncle   . Heart disease Mother   . Hypertension Mother     Social History   Social History  . Marital status: Married    Spouse name: N/A  . Number of children: N/A  . Years of education: N/A   Social History Main Topics  . Smoking status: Never Smoker  . Smokeless tobacco: None  . Alcohol use No  . Drug use: No  . Sexual activity: Not Asked     Comment: BTL   Other Topics Concern  . None   Social History Narrative   Work or School: Programmer, multimedia, [employer] Situation: lives with husband and 3 kids (12, 9 and 36 yo in 2015)      Spiritual Beliefs: Christian      Lifestyle: no regular CV exercise; fair              Current Outpatient Prescriptions:  Marland Kitchen  Multiple Vitamin (MULTIVITAMINS PO), Take by mouth., Disp: , Rfl:  .  norethindrone-ethinyl estradiol (BALZIVA) 0.4-35 MG-MCG tablet, Take 1 tablet by mouth daily., Disp: , Rfl:    EXAM:  Vitals:   07/16/16 1044  BP: 118/68  Pulse: 84  Temp: 98.8 F (37.1 C)    Body mass index is 26.37 kg/m.  GENERAL: vitals reviewed and listed above, alert, oriented, appears well hydrated and in no acute distress  HEENT: atraumatic, conjunttiva clear, no obvious abnormalities on inspection of external nose and ears, normal appearance of ear canals and TMs, clear nasal congestion, mild post oropharyngeal erythema with PND, 1+ tonsillar edema with tonsillith or exudate, no sinus TTP  NECK: no obvious masses on inspection  LUNGS: clear to auscultation bilaterally, no wheezes, rales or rhonchi, good air movement  CV: HRRR, no peripheral edema  MS: moves all extremities without noticeable abnormality  PSYCH: pleasant and cooperative, no obvious depression or anxiety  ASSESSMENT AND PLAN:  Discussed the following assessment and plan:  Sore throat - Plan: POC Rapid Strep A, Throat culture, Culture, Group A Strep  GAD (generalized anxiety disorder)  BMI 26.0-26.9,adult  -strep testing, treat if positive, more likely viral URI, supportive care and return precuations discussed -discussed tx options for stress and anxiety, opted to start with CBT, discussed options -lifestlye recs for weight and stress -follow up 3 months or sooner if needed -Patient advised to  return or notify a doctor immediately if symptoms worsen or persist or new concerns arise.  Patient Instructions  BEFORE YOU LEAVE: -follow up: follow up in 3 months -rapid and strep culture   Please do cognitive behavioral therapy for the anxiety. Consider the phone apps.   Colin Benton R., DO

## 2016-07-16 NOTE — Patient Instructions (Signed)
BEFORE YOU LEAVE: -follow up: follow up in 3 months -rapid and strep culture   Please do cognitive behavioral therapy for the anxiety. Consider the phone apps.

## 2016-07-16 NOTE — Progress Notes (Signed)
Pre visit review using our clinic review tool, if applicable. No additional management support is needed unless otherwise documented below in the visit note. 

## 2016-07-18 LAB — CULTURE, GROUP A STREP: ORGANISM ID, BACTERIA: NORMAL

## 2017-05-19 DIAGNOSIS — Z1231 Encounter for screening mammogram for malignant neoplasm of breast: Secondary | ICD-10-CM | POA: Diagnosis not present

## 2017-05-19 DIAGNOSIS — Z01419 Encounter for gynecological examination (general) (routine) without abnormal findings: Secondary | ICD-10-CM | POA: Diagnosis not present

## 2017-05-19 DIAGNOSIS — Z124 Encounter for screening for malignant neoplasm of cervix: Secondary | ICD-10-CM | POA: Diagnosis not present

## 2017-06-16 ENCOUNTER — Encounter: Payer: Self-pay | Admitting: Family Medicine

## 2017-06-16 ENCOUNTER — Ambulatory Visit (INDEPENDENT_AMBULATORY_CARE_PROVIDER_SITE_OTHER): Payer: 59 | Admitting: Family Medicine

## 2017-06-16 VITALS — BP 92/58 | HR 99 | Temp 98.4°F | Ht 62.75 in | Wt 148.2 lb

## 2017-06-16 DIAGNOSIS — M545 Low back pain, unspecified: Secondary | ICD-10-CM

## 2017-06-16 DIAGNOSIS — R0789 Other chest pain: Secondary | ICD-10-CM | POA: Diagnosis not present

## 2017-06-16 DIAGNOSIS — R002 Palpitations: Secondary | ICD-10-CM

## 2017-06-16 NOTE — Progress Notes (Signed)
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  Past Medical History:  Diagnosis Date  . Abnormal Pap smear 2004  . Dyspareunia   . History of fainting spells of unknown cause   . Migraines   . Palpitations   . Pelvic pain in female   . Post - coital bleeding   . Post partum depression   . Yeast vaginitis    recurrent     Past Surgical History:  Procedure Laterality Date  . CESAREAN SECTION    . TUBAL LIGATION      Family History  Problem Relation Age of Onset  . Hypertension Maternal Uncle   . Heart disease Mother   . Hypertension Mother     Social History   Social History  . Marital status: Married    Spouse name: N/A  . Number of children: N/A  . Years of education: N/A   Social History Main Topics  . Smoking status: Never Smoker  . Smokeless tobacco: Never Used  . Alcohol use No  . Drug use: No  . Sexual activity: Not Asked     Comment: BTL   Other Topics Concern  . 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  norethindrone-ethinyl estradiol (BALZIVA) 0.4-35 MG-MCG tablet, Take 1 tablet by mouth daily., Disp: , Rfl:   EXAM:  Vitals:   06/16/17 1630  BP: (!) 92/58  Pulse: 99  Temp: 98.4 F (36.9 C)    Body mass index is 26.46 kg/m.  GENERAL: vitals reviewed and listed above, alert, oriented, appears well hydrated and in no acute distress  HEENT: atraumatic, conjunttiva clear, no obvious abnormalities on inspection of external nose and ears  NECK: no obvious masses on inspection  LUNGS: clear to auscultation bilaterally, no wheezes, rales or rhonchi, good air movement  CV: HRRR, no peripheral edema  MS: moves all extremities without noticeable abnormality  PSYCH: pleasant and cooperative, no obvious depression or anxiety  ASSESSMENT AND PLAN:  Discussed the following assessment and plan:  Palpitations - Plan: EKG 12-Lead  Atypical chest pain  Acute right-sided low back pain without sciatica  -we discussed possible serious and likely etiologies, workup and treatment options, treatment risks and return and emergency precautions for each issue and opted to start with the following: -for palpitations and atypical CP opted for EKG (reviewed and  NSR), labs (TSH, CBC), avoidance caffeine, cardiology eval given her long hx of tachycardia and possible septal defect? Strict precautions in interim if any severe or sustained symptoms, new symptoms, sob, dizziness etc to go to ER. -for back suspect musculoskeletal and opted to start with a trial of HEP, conservative symptomatic care -follow up 1 month -Patient advised to return or notify a doctor immediately if symptoms worsen or persist or new concerns arise.  Patient Instructions  BEFORE YOU LEAVE: -EKG -lab for TSH and cbc -please order -referral to cardiology for palpitations -low back exercises -follow up: 1 month  -We placed a referral for  you as discussed. It usually takes about 1-2 weeks to process and schedule this referral. If you have not heard from [place] regarding this appointment in 2 weeks please contact our office.  We have ordered labs or studies at this visit. It can take up to 1-2 weeks for results and processing. IF results require follow up or explanation, we will call you with instructions. Clinically stable results will be released to your [facility]. If you have not heard from [place] or cannot find your results in [facility] in 2 weeks please contact our office at [phone].  If you are not yet signed up for [facility], please consider signing up.  Avoid caffeine  Do the low back exercises 4 days per week  Can use heat and tiger balm (menthol) or over the counter pain medications per instructions as needed for pain  I hope you are feeling better soon! Seek care sooner if worsening, new concerns or you are not improving with treatment.           Colin Benton R., DO

## 2017-06-16 NOTE — Patient Instructions (Signed)
BEFORE YOU LEAVE: -EKG -lab for TSH and cbc -please order -referral to cardiology for palpitations -low back exercises -follow up: 1 month  -We placed a referral for you as discussed. It usually takes about 1-2 weeks to process and schedule this referral. If you have not heard from [place] regarding this appointment in 2 weeks please contact our office.  We have ordered labs or studies at this visit. It can take up to 1-2 weeks for results and processing. IF results require follow up or explanation, we will call you with instructions. Clinically stable results will be released to your [facility]. If you have not heard from [place] or cannot find your results in [facility] in 2 weeks please contact our office at [phone].  If you are not yet signed up for [facility], please consider signing up.  Avoid caffeine  Do the low back exercises 4 days per week  Can use heat and tiger balm (menthol) or over the counter pain medications per instructions as needed for pain  I hope you are feeling better soon! Seek care sooner if worsening, new concerns or you are not improving with treatment.

## 2017-06-17 LAB — CBC
HCT: 36.5 % (ref 36.0–46.0)
Hemoglobin: 12.6 g/dL (ref 12.0–15.0)
MCHC: 34.5 g/dL (ref 30.0–36.0)
MCV: 92.6 fl (ref 78.0–100.0)
Platelets: 294 10*3/uL (ref 150.0–400.0)
RBC: 3.94 Mil/uL (ref 3.87–5.11)
RDW: 13.1 % (ref 11.5–15.5)
WBC: 4.2 10*3/uL (ref 4.0–10.5)

## 2017-06-17 LAB — TSH: TSH: 1.98 u[IU]/mL (ref 0.35–4.50)

## 2017-07-14 ENCOUNTER — Ambulatory Visit: Payer: 59 | Admitting: Family Medicine

## 2017-07-14 NOTE — Progress Notes (Deleted)
  HPI:  Follow up Low back pain and palpitations. See notes from 05/2017 - reviewed. Palpitations chronic with cardiology eval in the past per her report. Low back pain R for a few months with more sitting then usual.  Labs looked good. Reports. Denies.  ROS: See pertinent positives and negatives per HPI.  Past Medical History:  Diagnosis Date  . Abnormal Pap smear 2004  . Dyspareunia   . History of fainting spells of unknown cause   . Migraines   . Palpitations   . Pelvic pain in female   . Post - coital bleeding   . Post partum depression   . Yeast vaginitis    recurrent     Past Surgical History:  Procedure Laterality Date  . CESAREAN SECTION    . TUBAL LIGATION      Family History  Problem Relation Age of Onset  . Hypertension Maternal Uncle   . Heart disease Mother   . Hypertension Mother     Social History   Social History  . Marital status: Married    Spouse name: N/A  . Number of children: N/A  . Years of education: N/A   Social History Main Topics  . Smoking status: Never Smoker  . Smokeless tobacco: Never Used  . Alcohol use No  . Drug use: No  . Sexual activity: Not on file     Comment: BTL   Other Topics Concern  . Not on file   Social History Narrative   Work or School: [employer], [employer] Situation: lives with husband and 3 kids (54, 4 and 32 yo in 2015)      Spiritual Beliefs: Christian      Lifestyle: no regular CV exercise; fair              Current Outpatient Prescriptions:  Marland Kitchen  Multiple Vitamin (MULTIVITAMINS PO), Take by mouth., Disp: , Rfl:  .  norethindrone-ethinyl estradiol (BALZIVA) 0.4-35 MG-MCG tablet, Take 1 tablet by mouth daily., Disp: , Rfl:   EXAM:  There were no vitals filed for this visit.  There is no height or weight on file to calculate BMI.  GENERAL: vitals reviewed and listed above, alert, oriented, appears well hydrated and in no acute distress  HEENT: atraumatic, conjunttiva clear,  no obvious abnormalities on inspection of external nose and ears  NECK: no obvious masses on inspection  LUNGS: clear to auscultation bilaterally, no wheezes, rales or rhonchi, good air movement  CV: HRRR, no peripheral edema  MS: moves all extremities without noticeable abnormality  PSYCH: pleasant and cooperative, no obvious depression or anxiety  ASSESSMENT AND PLAN:  Discussed the following assessment and plan:  No diagnosis found.  -Patient advised to return or notify a doctor immediately if symptoms worsen or persist or new concerns arise.  There are no Patient Instructions on file for this visit.  Colin Benton R., DO

## 2017-09-08 ENCOUNTER — Encounter: Payer: Self-pay | Admitting: Family Medicine

## 2017-09-13 ENCOUNTER — Encounter: Payer: Self-pay | Admitting: *Deleted

## 2017-09-13 ENCOUNTER — Encounter: Payer: Self-pay | Admitting: Family Medicine

## 2017-09-13 ENCOUNTER — Ambulatory Visit (INDEPENDENT_AMBULATORY_CARE_PROVIDER_SITE_OTHER): Payer: 59 | Admitting: Family Medicine

## 2017-09-13 VITALS — BP 92/60 | HR 98 | Temp 98.2°F | Ht 62.75 in | Wt 147.5 lb

## 2017-09-13 DIAGNOSIS — M545 Low back pain, unspecified: Secondary | ICD-10-CM

## 2017-09-13 DIAGNOSIS — G8929 Other chronic pain: Secondary | ICD-10-CM

## 2017-09-13 DIAGNOSIS — R32 Unspecified urinary incontinence: Secondary | ICD-10-CM

## 2017-09-13 DIAGNOSIS — R350 Frequency of micturition: Secondary | ICD-10-CM | POA: Diagnosis not present

## 2017-09-13 LAB — POCT URINALYSIS DIPSTICK
BILIRUBIN UA: NEGATIVE
Glucose, UA: NEGATIVE
KETONES UA: NEGATIVE
Leukocytes, UA: NEGATIVE
NITRITE UA: NEGATIVE
PROTEIN UA: NEGATIVE
SPEC GRAV UA: 1.02 (ref 1.010–1.025)
UROBILINOGEN UA: 0.2 U/dL
pH, UA: 7.5 (ref 5.0–8.0)

## 2017-09-13 NOTE — Patient Instructions (Signed)
BEFORE YOU LEAVE: -please add urine micro and culture -xray sheet -lab -follow up 1 month  Get xray of the back  We have ordered labs or studies at this visit. It can take up to 1-2 weeks for results and processing. IF results require follow up or explanation, we will call you with instructions. Clinically stable results will be released to your [facility]. If you have not heard from [place] or cannot find your results in [facility] in 2 weeks please contact our office at [phone].  If you are not yet signed up for [facility], please consider signing up.

## 2017-09-13 NOTE — Progress Notes (Signed)
HPI:  [name] is a pleasant 46 yo here for an acute visit for:  Dysuria: -for several weeks -frequency, urgency, a few occassions of incontinence - dribbles out before she makes it to the bathroom -no fevers, malaise, hematuria, flank pain, vaginal symptoms -fdlmp 2 weeks ago  Low back pain: -resolved with exercises after last visit, no back pain today -however, had some back pain a gain a few weeks ago,resolved with muscle relaxer -she is worried about kidney failure -had a few itchy bumps on ankles and wrists - thought initially was bug bites, now healing, but she wonders if this if from kidney failure  Due for flu shot    ROS: See pertinent positives and negatives per HPI.  Past Medical History:  Diagnosis Date  . Abnormal Pap smear 2004  . Dyspareunia   . History of fainting spells of unknown cause   . Migraines   . Palpitations   . Pelvic pain in female   . Post - coital bleeding   . Post partum depression   . Yeast vaginitis    recurrent     Past Surgical History:  Procedure Laterality Date  . CESAREAN SECTION    . TUBAL LIGATION      Family History  Problem Relation Age of Onset  . Hypertension Maternal Uncle   . Heart disease Mother   . Hypertension Mother     Social History   Social History  . Marital status: Married    Spouse name: N/A  . Number of children: N/A  . Years of education: N/A   Social History Main Topics  . Smoking status: Never Smoker  . Smokeless tobacco: Never Used  . Alcohol use No  . Drug use: No  . Sexual activity: Not Asked     Comment: BTL   Other Topics Concern  . None   Social History Narrative   Work or School: Sport and exercise psychologist, [employer] Situation: lives with husband and 3 kids (19, 10 and 40 yo in 2015)      Spiritual Beliefs: Christian      Lifestyle: no regular CV exercise; fair             No current outpatient prescriptions on file.  EXAM:  Vitals:   09/13/17 1538  BP:  92/60  Pulse: 98  Temp: 98.2 F (36.8 C)    Body mass index is 26.34 kg/m.  GENERAL: vitals reviewed and listed above, alert, oriented, appears well hydrated and in no acute distress  HEENT: atraumatic, conjunttiva clear, no obvious abnormalities on inspection of external nose and ears  NECK: no obvious masses on inspection  LUNGS: clear to auscultation bilaterally, no wheezes, rales or rhonchi, good air movement  CV: HRRR, no peripheral edema  ABD: BS+, soft, NTTP, no CVA TTP  MS: moves all extremities without noticeable abnormality Normal Gait Normal inspection of back, no obvious scoliosis or leg length descrepancy No bony TTP Soft tissue TTP [id number] -/+ tests: neg trendelenburg,-facet loading, -SLRT, -CLRT, -FABER, -FADIR Normal muscle strength, sensation to light touch and DTRs in LEs bilaterally  PSYCH: pleasant and cooperative, no obvious depression or anxiety  ASSESSMENT AND PLAN:  Discussed the following assessment and plan:  Urinary frequency  Urinary incontinenc - Plan: POC Urinalysis Dipstick, Basic metabolic panel, Culture, Urine, Urine Microscopic Only  -Udip ok - tr blood seen frequently on our dips in normal pts -micro, BMP, culture pending -urology eval for the incontinence  if persists and studies unrevealing  Chronic low back pain without sciatica, unspecified back pain laterality - Plan: DG Lumbar Spine Complete -resolved now, but intermittent in the past and she is very concerned about  -no symptoms today and normal exam today -discussed etiologies back pain -lumbar films, HEP, f/u if recurs  -Patient advised to return or notify a doctor immediately if symptoms worsen or persist or new concerns arise.  Patient Instructions  BEFORE YOU LEAVE: -please add urine micro and culture -xray sheet -lab -follow up 1 month  Get xray of the back  We have ordered labs or studies at this visit. It can take up to 1-2 weeks for results and processing.  IF results require follow up or explanation, we will call you with instructions. Clinically stable results will be released to your [facility]. If you have not heard from [place] or cannot find your results in [facility] in 2 weeks please contact our office at [phone].  If you are not yet signed up for [facility], please consider signing up.          Colin Benton R., DO

## 2017-09-14 LAB — BASIC METABOLIC PANEL
BUN: 9 mg/dL (ref 6–23)
CHLORIDE: 105 meq/L (ref 96–112)
CO2: 27 mEq/L (ref 19–32)
Calcium: 9.6 mg/dL (ref 8.4–10.5)
Creatinine, Ser: 0.8 mg/dL (ref 0.40–1.20)
GFR: 99.9 mL/min (ref >60.00)
GLUCOSE: 90 mg/dL (ref 70–99)
POTASSIUM: 4.1 meq/L (ref 3.5–5.1)
Sodium: 138 mEq/L (ref 135–145)

## 2017-09-14 LAB — URINALYSIS, MICROSCOPIC ONLY

## 2017-09-15 LAB — URINE CULTURE
MICRO NUMBER:: 81060631
SPECIMEN QUALITY: ADEQUATE

## 2017-09-23 ENCOUNTER — Ambulatory Visit (INDEPENDENT_AMBULATORY_CARE_PROVIDER_SITE_OTHER): Payer: 59 | Admitting: Family Medicine

## 2017-09-23 ENCOUNTER — Encounter: Payer: Self-pay | Admitting: Family Medicine

## 2017-09-23 ENCOUNTER — Encounter: Payer: Self-pay | Admitting: *Deleted

## 2017-09-23 VITALS — BP 100/72 | HR 97 | Temp 98.5°F | Ht 62.75 in

## 2017-09-23 DIAGNOSIS — M5442 Lumbago with sciatica, left side: Secondary | ICD-10-CM

## 2017-09-23 MED ORDER — CYCLOBENZAPRINE HCL 5 MG PO TABS: 5.0000 mg | ORAL_TABLET | Freq: Every evening | ORAL | 0 refills | Status: DC | PRN

## 2017-09-23 NOTE — Patient Instructions (Addendum)
Keep follow up as scheduled.  Get the xray.  Do the exercises consisently 4-5 days per week.  Can use heat (rice sock), tiger balm (menthol topical product), aleve per instructions as needed for pain.  Also check out [employer]" which is a program developed by Dr. Minerva Ends. See YouTube link below:  A good intro video is: "Independence from Pain 7-minute Video" - [email or web address].com   I hope you feel better soon. Let us know if new symptoms or concerns.

## 2017-09-23 NOTE — Progress Notes (Signed)
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 and is sore today, feels that sitting for long periods of time makes pain worse -has soreness in the low back,bilaterally, occasionally feels a brief pain down the left leg posteriorly -she has Flexeril another doctor gave her in the past and this helped her symptoms, but she only had a few in his run out, requests refill -not doing the exercises consistently, did not get xray -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 She would like to take off from work this weekend if possible. Doesn't want to take off from her other job though. -Denies:weakness, numbness, bowel or bladder dysfunction, fevers, malaise  ROS: See pertinent positives and negatives per HPI.  Past Medical History:  Diagnosis Date  . Abnormal Pap smear 2004  . Dyspareunia   . History of fainting spells of unknown cause   . Migraines   . Palpitations   . Pelvic pain in female   . Post - coital bleeding   . Post partum depression   . Yeast vaginitis    recurrent     Past Surgical History:  Procedure Laterality Date  . CESAREAN SECTION    . TUBAL LIGATION      Family History  Problem Relation Age of Onset  . Hypertension Maternal Uncle   . Heart disease Mother   . Hypertension Mother     Social History   Social History  . Marital status: Married    Spouse name: N/A  . Number of children: N/A  . Years of education: N/A   Social History Main Topics  . Smoking status: Never Smoker  . Smokeless tobacco:  Never Used  . Alcohol use No  . Drug use: No  . Sexual activity: Not Asked     Comment: BTL   Other Topics Concern  . 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  EXAM:  Vitals:   09/23/17 1301  BP: 100/72  Pulse: 97  Temp: 98.5 F (36.9 C)    There is no height or weight on file to calculate BMI.  GENERAL: vitals reviewed and listed above, alert, oriented, appears well hydrated and in no acute distress  HEENT: atraumatic, conjunttiva clear, no obvious abnormalities on inspection of external nose and ears  NECK: no obvious masses on inspection  LUNGS: clear to auscultation bilaterally, no wheezes, rales or rhonchi, good air movement  CV: HRRR, no peripheral edema  MS: moves all extremities without noticeable abnormality Normal Gait Normal inspection of back, no obvious scoliosis or leg length descrepancy No bony TTP Soft tissue TTP [id number] to palpation of the left lower lumbar paraspinal muscles -/+  tests: neg trendelenburg,+facet loading left, -SLRT, -CLRT, -FABER, -FADIR Normal muscle strength, sensation to light touch and DTRs in LEs bilaterally  PSYCH: pleasant and cooperative, no obvious depression or anxiety  ASSESSMENT AND PLAN:  Discussed the following assessment and plan:  Acute left-sided low back pain with left-sided sciatica  -we discussed possible serious and likely etiologies, workup and treatment, treatment risks and return precautions -we discussed how common back pain is and degenerative disc disease, did recommend she do the x-rays, she has mild radicular symptoms at times so we could do a prednisone taper if this worsens or persist. Given she was better, but has had symptoms for a day -  I think it is reasonable to start with the x-rays and conservative treatment for discomfort and the prednisone if needed. -gave her a note for work that she was seen today and that she should stretch for a minute or 2 for every 45 minutes and she does sit down desk work, it seems she has the option to stand up while she works and that this would fix her concern of worsening pain with sitting so advised her to try this -after this discussion, Satrina opted for see above, home exercises, Flexeril at night -follow up advised as scheduled in the next 2-3 weeks -of course, we advised Maryela  to return or notify a doctor immediately if symptoms worsen or persist or new concerns arise.   Patient Instructions  Keep follow up as scheduled.  Get the xray.  Do the exercises consisently 4-5 days per week.  Can use heat (rice sock), tiger balm (menthol topical product), aleve per instructions as needed for pain.  Also check out [employer]" which is a program developed by Dr. Minerva Ends. See YouTube link below:  A good intro video is: "Independence from Pain 7-minute Video" - [email or web address].com   I hope you feel better soon. Let us know if new symptoms or concerns.   Colin Benton R., DO

## 2017-09-30 ENCOUNTER — Ambulatory Visit (INDEPENDENT_AMBULATORY_CARE_PROVIDER_SITE_OTHER)
Admission: RE | Admit: 2017-09-30 | Discharge: 2017-09-30 | Disposition: A | Discharge status: Home / Self Care | Payer: 59 | Source: Ambulatory Visit | Attending: Family Medicine | Admitting: Family Medicine

## 2017-09-30 DIAGNOSIS — M545 Low back pain: Secondary | ICD-10-CM

## 2017-09-30 DIAGNOSIS — G8929 Other chronic pain: Secondary | ICD-10-CM | POA: Diagnosis not present

## 2017-10-13 ENCOUNTER — Ambulatory Visit: Payer: 59 | Admitting: Family Medicine

## 2017-12-29 ENCOUNTER — Encounter: Payer: Self-pay | Admitting: Family Medicine

## 2018-03-03 DIAGNOSIS — Z7689 Persons encountering health services in other specified circumstances: Secondary | ICD-10-CM | POA: Diagnosis not present

## 2018-03-03 DIAGNOSIS — N63 Unspecified lump in unspecified breast: Secondary | ICD-10-CM | POA: Diagnosis not present

## 2018-03-16 DIAGNOSIS — R928 Other abnormal and inconclusive findings on diagnostic imaging of breast: Secondary | ICD-10-CM | POA: Diagnosis not present

## 2018-05-03 ENCOUNTER — Telehealth: Payer: Self-pay | Admitting: Internal Medicine

## 2018-05-03 NOTE — Telephone Encounter (Signed)
Spoke with the patient she is aware of Dr. Arman Filter determination

## 2018-05-03 NOTE — Telephone Encounter (Signed)
-----   Message from Philemon Kingdom, MD sent at 05/02/2018  1:00 PM EDT ----- Regarding: RE: self referral  I will decline since very close to menopause (at which time PCOS usually resolves) and with normal testosterone level per chart review. Ty! C ----- Message ----- From: Armen Pickup Sent: 05/01/2018  12:48 PM To: Philemon Kingdom, MD Subject: RE: self referral                              Duh! Sorry she wanted to be seen for Hirsutism and PCOS ----- Message ----- From: Philemon Kingdom, MD Sent: 05/01/2018  12:22 PM To: Armen Pickup Subject: RE: self referral                              What is the reason for the visit? Ty, C ----- Message ----- From: Armen Pickup Sent: 05/01/2018  10:53 AM To: Brunilda Payor, Philemon Kingdom, MD Subject: self referral                                  Please review the chart and let us know if ok to schedule. She would prefer a female.  Colletta [place]

## 2018-05-03 NOTE — Telephone Encounter (Signed)
LM for pt to call back to let her know of Dr.Gherghe's decision

## 2018-07-18 DIAGNOSIS — Z124 Encounter for screening for malignant neoplasm of cervix: Secondary | ICD-10-CM | POA: Diagnosis not present

## 2018-07-18 DIAGNOSIS — N920 Excessive and frequent menstruation with regular cycle: Secondary | ICD-10-CM | POA: Diagnosis not present

## 2018-07-18 DIAGNOSIS — Z01419 Encounter for gynecological examination (general) (routine) without abnormal findings: Secondary | ICD-10-CM | POA: Diagnosis not present

## 2018-08-14 ENCOUNTER — Telehealth: Payer: Self-pay | Admitting: Orthopedic Surgery

## 2018-08-14 NOTE — Telephone Encounter (Signed)
Ok we will see

## 2018-08-14 NOTE — Telephone Encounter (Signed)
Patient sent records in regard to possibly scheduling appointment for lower and upper back pain, and possible scoliosis. States had spoken to someone at our clinic last week.  I relayed records have been received - notes and Xray reports were on our fax this morning*Placed in Dr Harrison's box*Also in [facility] system.  States has seen primary care only, and no back specialist. Patient aware to be reviewed by Dr Aline Brochure, and advised.  Patient's phone# [phone].

## 2018-08-15 NOTE — Telephone Encounter (Signed)
Called patient to relay; left voice message to return call regarding scheduling appointment.

## 2018-08-16 NOTE — Telephone Encounter (Signed)
Called back/followed up; appointment scheduled.

## 2018-08-18 DIAGNOSIS — N3946 Mixed incontinence: Secondary | ICD-10-CM | POA: Diagnosis not present

## 2018-08-18 DIAGNOSIS — Z09 Encounter for follow-up examination after completed treatment for conditions other than malignant neoplasm: Secondary | ICD-10-CM | POA: Diagnosis not present

## 2018-09-08 ENCOUNTER — Encounter: Payer: Self-pay | Admitting: Orthopedic Surgery

## 2018-09-08 ENCOUNTER — Ambulatory Visit: Payer: 59 | Admitting: Orthopedic Surgery

## 2018-09-08 ENCOUNTER — Ambulatory Visit (INDEPENDENT_AMBULATORY_CARE_PROVIDER_SITE_OTHER): Payer: 59

## 2018-09-08 VITALS — BP 113/62 | HR 90 | Ht 63.0 in | Wt 146.0 lb

## 2018-09-08 DIAGNOSIS — G8929 Other chronic pain: Secondary | ICD-10-CM

## 2018-09-08 DIAGNOSIS — M5441 Lumbago with sciatica, right side: Secondary | ICD-10-CM

## 2018-09-08 DIAGNOSIS — M4126 Other idiopathic scoliosis, lumbar region: Secondary | ICD-10-CM | POA: Diagnosis not present

## 2018-09-08 NOTE — Progress Notes (Signed)
NEW PATIENT OFFICE VISI  Chief Complaint  Patient presents with  . Back Pain    Upper back and lower back pain for over a year. History of scoliosis    47 year old female says she comes in today for evaluation of her lower back.  She gives a history of snoring she had scoliosis at age 55 but [place]  Presents now with occasional right sided radicular pain usually relieved with ibuprofen mid to lower back pain mild to moderate in severity dull and aching in quality with pain greater than 1 year   Review of Systems  Constitutional: Negative for chills, fever, malaise/fatigue and weight loss.  Gastrointestinal: Negative for constipation.       Denies loss bowel control   Genitourinary:       Denies urinary retention or los of bladder control      Past Medical History:  Diagnosis Date  . Abnormal Pap smear 2004  . Dyspareunia   . History of fainting spells of unknown cause   . Migraines   . Palpitations   . Pelvic pain in female   . Post - coital bleeding   . Post partum depression   . Scoliosis   . Yeast vaginitis    recurrent     Past Surgical History:  Procedure Laterality Date  . CESAREAN SECTION    . TUBAL LIGATION      Family History  Problem Relation Age of Onset  . Hypertension Maternal Uncle   . Heart disease Mother   . Hypertension Mother    Social History   Tobacco Use  . Smoking status: Never Smoker  . Smokeless tobacco: Never Used  Substance Use Topics  . Alcohol use: No  . Drug use: No    Allergies  Allergen Reactions  . Hydrocodone Nausea Only and Other (See Comments)    dizziness  . Tramadol Nausea And Vomiting and Other (See Comments)    dizziness    Current Meds  Medication Sig  . Multiple Vitamins-Minerals (MULTIVITAMIN ADULT PO) multivitamin    BP 113/62   Pulse 90   Ht 5\' 3"  (1.6 m)   Wt 146 lb (66.2 kg)   LMP 08/25/2018 (Exact Date)   BMI 25.86 kg/m   Physical Exam   Constitutional: She is oriented to person, place, and time. She appears well-developed and well-nourished.  Neurological: She is alert and oriented to person, place, and time.  Psychiatric: She has a normal mood and affect. Judgment normal.  Vitals reviewed.   Back Exam   Tenderness  The patient is experiencing tenderness in the lumbar.  Range of Motion  Extension: normal  Flexion: normal  Lateral bend right: normal  Rotation right: normal   Muscle Strength  Right Quadriceps:  5/5  Left Quadriceps:  5/5  Right Hamstrings:  5/5  Left Hamstrings:  5/5   Tests  Straight leg raise right: negative Straight leg raise left: negative  Reflexes  Patellar: normal  Other  Toe walk: normal Heel walk: normal Sensation: normal Gait: normal  Erythema: no back redness Scars: absent  Comments:  In the Adams position the patient exhibited a slight hump on the right thoracolumbar region and difference in hip height        [place] were done at Hospital and repeated at the officelumbar scoliosis approximately 28 degrees on hospital film (my interpretation)  Intraoffice film confirms primarily lumbar scoliosis at 28 degrees See  dictated report   Encounter Diagnoses  Name Primary?  . Chronic midline low back pain with right-sided sciatica   . Other idiopathic scoliosis, lumbar region Yes    PLAN: (Rx., injectx, surgery, frx, mri/ct) Adult scoliosis onset in adolescence recommend she see Dr. Patrice Paradise for management  No orders of the defined types were placed in this encounter.   Arther Abbott, MD  09/08/2018 9:15 AM

## 2018-09-08 NOTE — Patient Instructions (Signed)
Scoliosis Scoliosis is the name given to a spine that curves sideways.Scoliosis can cause twisting of your shoulders, hips, chest, back, and rib cage. What are the causes? The cause of scoliosis is not always known. It may be caused by a birth defect or by a disease that can cause muscular dysfunction and imbalance, such as cerebral palsy and muscular dystrophy. What increases the risk? Having a disease that causes muscle disease or dysfunction. What are the signs or symptoms? Scoliosis often has no signs or symptoms.If they are present, they may include:  Unequal size of one body side compared to the other (asymmetry).  Visible curvature of the spine.  Pain. The pain may limit physical activity.  Shortness of breath.  Bowel or bladder issues.  How is this diagnosed? A skilled health care provider will perform an evaluation. This will involve:  Taking your history.  Performing a physical examination.  Performing a neurological exam to detect nerve or muscle function loss.  Range of motion studies on the spine.  X-rays.  An MRI may also be obtained. How is this treated? Treatment varies depending on the nature, extent, and severity of the disease. If the curvature is not great, you may need only observation. A brace may be used to prevent scoliosis from progressing. A brace may also be needed during growth spurts. Physical therapy may be of benefit. Surgery may be required. Follow these instructions at home:  Your health care provider may suggest exercises to strengthen your muscles. Perform them as directed.  Ask your health care provider before participating in any sports.  If you have been prescribed an orthopedic brace, wear it as instructed by your health care provider. Contact a health care provider if: Your brace causes the skin to become sore (chafe) or is uncomfortable. Get help right away if:  You have back pain that is not relieved by the medicines prescribed  by your health care provider.  Your legs feel weak or you lose function in your legs.  You lose some bowel or bladder control. This information is not intended to replace advice given to you by your health care provider. Make sure you discuss any questions you have with your health care provider. Document Released: 12/03/2000 Document Revised: 05/13/2016 Document Reviewed: 06/10/2016 Elsevier Interactive Patient Education  2018 Elsevier Inc.  

## 2018-09-14 DIAGNOSIS — M545 Low back pain: Secondary | ICD-10-CM | POA: Diagnosis not present

## 2018-09-14 DIAGNOSIS — M419 Scoliosis, unspecified: Secondary | ICD-10-CM | POA: Diagnosis not present

## 2018-09-14 DIAGNOSIS — Z6825 Body mass index (BMI) 25.0-25.9, adult: Secondary | ICD-10-CM | POA: Diagnosis not present

## 2018-11-29 DIAGNOSIS — Z09 Encounter for follow-up examination after completed treatment for conditions other than malignant neoplasm: Secondary | ICD-10-CM | POA: Diagnosis not present

## 2018-11-29 DIAGNOSIS — N920 Excessive and frequent menstruation with regular cycle: Secondary | ICD-10-CM | POA: Diagnosis not present

## 2018-11-29 DIAGNOSIS — Z3040 Encounter for surveillance of contraceptives, unspecified: Secondary | ICD-10-CM | POA: Diagnosis not present

## 2018-12-11 DIAGNOSIS — H5202 Hypermetropia, left eye: Secondary | ICD-10-CM | POA: Diagnosis not present

## 2019-01-22 DIAGNOSIS — R293 Abnormal posture: Secondary | ICD-10-CM | POA: Diagnosis not present

## 2019-01-22 DIAGNOSIS — M545 Low back pain: Secondary | ICD-10-CM | POA: Diagnosis not present

## 2019-01-22 DIAGNOSIS — M546 Pain in thoracic spine: Secondary | ICD-10-CM | POA: Diagnosis not present

## 2019-01-29 DIAGNOSIS — R293 Abnormal posture: Secondary | ICD-10-CM | POA: Diagnosis not present

## 2019-01-29 DIAGNOSIS — M545 Low back pain: Secondary | ICD-10-CM | POA: Diagnosis not present

## 2019-01-29 DIAGNOSIS — M546 Pain in thoracic spine: Secondary | ICD-10-CM | POA: Diagnosis not present

## 2019-02-07 DIAGNOSIS — M546 Pain in thoracic spine: Secondary | ICD-10-CM | POA: Diagnosis not present

## 2019-02-07 DIAGNOSIS — M545 Low back pain: Secondary | ICD-10-CM | POA: Diagnosis not present

## 2019-02-07 DIAGNOSIS — R293 Abnormal posture: Secondary | ICD-10-CM | POA: Diagnosis not present

## 2019-02-14 DIAGNOSIS — M545 Low back pain: Secondary | ICD-10-CM | POA: Diagnosis not present

## 2019-02-14 DIAGNOSIS — M546 Pain in thoracic spine: Secondary | ICD-10-CM | POA: Diagnosis not present

## 2019-02-14 DIAGNOSIS — R293 Abnormal posture: Secondary | ICD-10-CM | POA: Diagnosis not present

## 2019-02-21 DIAGNOSIS — M546 Pain in thoracic spine: Secondary | ICD-10-CM | POA: Diagnosis not present

## 2019-02-21 DIAGNOSIS — R293 Abnormal posture: Secondary | ICD-10-CM | POA: Diagnosis not present

## 2019-02-21 DIAGNOSIS — M545 Low back pain: Secondary | ICD-10-CM | POA: Diagnosis not present

## 2019-04-11 DIAGNOSIS — Z09 Encounter for follow-up examination after completed treatment for conditions other than malignant neoplasm: Secondary | ICD-10-CM | POA: Diagnosis not present

## 2019-04-11 DIAGNOSIS — N898 Other specified noninflammatory disorders of vagina: Secondary | ICD-10-CM | POA: Diagnosis not present

## 2019-06-26 ENCOUNTER — Other Ambulatory Visit: Payer: Self-pay

## 2019-06-26 ENCOUNTER — Other Ambulatory Visit: Payer: 59

## 2019-06-26 DIAGNOSIS — Z20822 Contact with and (suspected) exposure to covid-19: Secondary | ICD-10-CM

## 2019-06-30 LAB — NOVEL CORONAVIRUS, NAA: SARS-CoV-2, NAA: NOT DETECTED

## 2019-07-16 ENCOUNTER — Encounter: Payer: 59 | Admitting: Family Medicine

## 2019-07-26 ENCOUNTER — Ambulatory Visit (INDEPENDENT_AMBULATORY_CARE_PROVIDER_SITE_OTHER): Payer: 59 | Admitting: Family Medicine

## 2019-07-26 ENCOUNTER — Encounter: Payer: Self-pay | Admitting: Family Medicine

## 2019-07-26 ENCOUNTER — Other Ambulatory Visit: Payer: Self-pay

## 2019-07-26 DIAGNOSIS — M545 Low back pain, unspecified: Secondary | ICD-10-CM

## 2019-07-26 DIAGNOSIS — Z711 Person with feared health complaint in whom no diagnosis is made: Secondary | ICD-10-CM | POA: Diagnosis not present

## 2019-07-26 DIAGNOSIS — N920 Excessive and frequent menstruation with regular cycle: Secondary | ICD-10-CM | POA: Diagnosis not present

## 2019-07-26 MED ORDER — CYCLOBENZAPRINE HCL 5 MG PO TABS: 5.0000 mg | ORAL_TABLET | Freq: Every day | ORAL | 1 refills | Status: DC

## 2019-07-26 NOTE — Progress Notes (Signed)
Virtual Visit via Video Note  I connected with [name] on 07/26/19 at  9:00 AM EDT by a video enabled telemedicine application and verified that I am speaking with the correct person using two identifiers.  Location patient: home Location provider:work or home office Persons participating in the virtual visit: patient, provider  I discussed the limitations of evaluation and management by telemedicine and the availability of in person appointments. The patient expressed understanding and agreed to proceed.   HPI: Pt seen for f/u and TOC, previously seen by Dr. Maudie Mercury.  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   -Also seen by PT.  Doing some of the exercises at home. -was on Flexeril in the past  Concern about DM: -Interested in checking Hgb A1c as has a family hx of DM.  -Endorses frequent urination, but this is a chronic condition.  Had urodynamic testing, offered mesh procedure. -Does not eat breakfast.  May notice she is "shaking" by 10 am -tries not to snack as states she gets sleepy after eating -had gestation DM with her 3rd pregnancy   Heavy menses: -followed by OB/Gyn -On Larin OCPs for increased bleeding 2/2 adenomyosis as seen on u/s  Pap and mammogram up to date.    ROS: See pertinent positives and negatives per HPI.  Social hx: Pt is a single mom, working 2 jobs.  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    Allergies: Tramadol and hydrocodone caused dizziness/n/v/ cold sweats  Past Medical History:  Diagnosis Date  . Abnormal Pap smear 2004  . Dyspareunia   . History of fainting spells of unknown cause   . Migraines   . Palpitations   . Pelvic pain in female   . Post - coital bleeding   . Post partum depression   . Scoliosis   . Yeast vaginitis    recurrent     Past Surgical History:   Procedure Laterality Date  . CESAREAN SECTION    . TUBAL LIGATION      Family History  Problem Relation Age of Onset  . Hypertension Maternal Uncle   . Heart disease Mother   . Hypertension Mother      Current Outpatient Medications:  .  cyclobenzaprine (FLEXERIL) 5 MG tablet, Take 1 tablet (5 mg total) by mouth at bedtime as needed for muscle spasms. (Patient not taking: Reported on 09/08/2018), Disp: 20 tablet, Rfl: 0 .  Multiple Vitamins-Minerals (MULTIVITAMIN ADULT PO), multivitamin, Disp: , Rfl:   EXAM:  VITALS per patient if applicable:  GENERAL: alert, oriented, appears well and in no acute distress  HEENT: atraumatic, conjunctiva clear, no obvious abnormalities on inspection of external nose and ears  NECK: normal movements of the head and neck  LUNGS: on inspection no signs of respiratory distress, breathing rate appears normal, no obvious gross SOB, gasping or wheezing  CV: no obvious cyanosis  MS: moves all visible extremities without noticeable abnormality  PSYCH/NEURO: pleasant and cooperative, no obvious depression or anxiety, speech and thought processing grossly intact  ASSESSMENT AND PLAN:  Discussed the following assessment and plan:  Lumbar back pain  -home exercises and stretching encouraged -pt encouraged to contact Ortho and Pt in regards to scheduling f/u appt - Plan: cyclobenzaprine (FLEXERIL) 5 MG tablet  Menorrhagia with regular cycle  -Continue Larin OCPs -Continue f/u with  OB/Gyn  Concern about DM without diagnosis -discussed regular eating and healthy eating habits. -will obtain labs at next [facility].  F/u prn in the next few wks for CPE and labs.   I discussed the assessment and treatment plan with the patient. The patient was provided an opportunity to ask questions and all were answered. The patient agreed with the plan and demonstrated an understanding of the instructions.   The patient was advised to call back or seek an in-person  evaluation if the symptoms worsen or if the condition fails to improve as anticipated.   Billie Ruddy, MD

## 2019-08-16 ENCOUNTER — Other Ambulatory Visit: Payer: Self-pay

## 2019-08-16 ENCOUNTER — Encounter: Payer: Self-pay | Admitting: Family Medicine

## 2019-08-16 ENCOUNTER — Ambulatory Visit: Payer: 59 | Admitting: Family Medicine

## 2019-08-16 VITALS — BP 98/72 | HR 74 | Temp 97.2°F | Wt 146.0 lb

## 2019-08-16 DIAGNOSIS — G43009 Migraine without aura, not intractable, without status migrainosus: Secondary | ICD-10-CM | POA: Diagnosis not present

## 2019-08-16 DIAGNOSIS — F439 Reaction to severe stress, unspecified: Secondary | ICD-10-CM | POA: Diagnosis not present

## 2019-08-16 DIAGNOSIS — Z0000 Encounter for general adult medical examination without abnormal findings: Secondary | ICD-10-CM | POA: Diagnosis not present

## 2019-08-16 DIAGNOSIS — R350 Frequency of micturition: Secondary | ICD-10-CM

## 2019-08-16 DIAGNOSIS — Z13220 Encounter for screening for lipoid disorders: Secondary | ICD-10-CM

## 2019-08-16 LAB — CBC WITH DIFFERENTIAL/PLATELET
Basophils Absolute: 0 10*3/uL (ref 0.0–0.1)
Basophils Relative: 0.5 % (ref 0.0–3.0)
Eosinophils Absolute: 0.1 10*3/uL (ref 0.0–0.7)
Eosinophils Relative: 1.2 % (ref 0.0–5.0)
HCT: 39.8 % (ref 36.0–46.0)
Hemoglobin: 13.3 g/dL (ref 12.0–15.0)
Lymphocytes Relative: 46.3 % — ABNORMAL HIGH (ref 12.0–46.0)
Lymphs Abs: 2.1 10*3/uL (ref 0.7–4.0)
MCHC: 33.4 g/dL (ref 30.0–36.0)
MCV: 94.2 fl (ref 78.0–100.0)
Monocytes Absolute: 0.4 10*3/uL (ref 0.1–1.0)
Monocytes Relative: 9.2 % (ref 3.0–12.0)
Neutro Abs: 1.9 10*3/uL (ref 1.4–7.7)
Neutrophils Relative %: 42.8 % — ABNORMAL LOW (ref 43.0–77.0)
Platelets: 303 10*3/uL (ref 150.0–400.0)
RBC: 4.23 Mil/uL (ref 3.87–5.11)
RDW: 12.9 % (ref 11.5–15.5)
WBC: 4.5 10*3/uL (ref 4.0–10.5)

## 2019-08-16 LAB — COMPREHENSIVE METABOLIC PANEL
ALT: 11 U/L (ref 0–35)
AST: 12 U/L (ref 0–37)
Albumin: 4.5 g/dL (ref 3.5–5.2)
Alkaline Phosphatase: 66 U/L (ref 39–117)
BUN: 7 mg/dL (ref 6–23)
CO2: 28 mEq/L (ref 19–32)
Calcium: 9.6 mg/dL (ref 8.4–10.5)
Chloride: 102 mEq/L (ref 96–112)
Creatinine, Ser: 0.83 mg/dL (ref 0.40–1.20)
GFR: 89.32 mL/min (ref >60.00)
Glucose, Bld: 86 mg/dL (ref 70–99)
Potassium: 4.3 mEq/L (ref 3.5–5.1)
Sodium: 137 mEq/L (ref 135–145)
Total Bilirubin: 0.6 mg/dL (ref 0.2–1.2)
Total Protein: 7.2 g/dL (ref 6.0–8.3)

## 2019-08-16 LAB — TSH: TSH: 3.58 u[IU]/mL (ref 0.35–4.50)

## 2019-08-16 LAB — POCT URINALYSIS DIPSTICK
Bilirubin, UA: NEGATIVE
Blood, UA: NEGATIVE
Glucose, UA: NEGATIVE
Ketones, UA: NEGATIVE
Leukocytes, UA: NEGATIVE
Nitrite, UA: NEGATIVE
Odor: NEGATIVE
Protein, UA: NEGATIVE
Spec Grav, UA: 1.015 (ref 1.010–1.025)
Urobilinogen, UA: 0.2 E.U./dL
pH, UA: 7 (ref 5.0–8.0)

## 2019-08-16 LAB — LIPID PANEL
Cholesterol: 166 mg/dL (ref 0–200)
HDL: 40.8 mg/dL (ref >39.00)
LDL Cholesterol: 97 mg/dL (ref 0–99)
NonHDL: 125.03
Total CHOL/HDL Ratio: 4
Triglycerides: 141 mg/dL (ref 0.0–149.0)
VLDL: 28.2 mg/dL (ref 0.0–40.0)

## 2019-08-16 LAB — HEMOGLOBIN A1C: Hgb A1c MFr Bld: 5.6 % (ref 4.6–6.5)

## 2019-08-16 LAB — T4, FREE: Free T4: 0.75 ng/dL (ref 0.60–1.60)

## 2019-08-16 MED ORDER — KETOROLAC TROMETHAMINE 60 MG/2ML IM SOLN
60.0000 mg | Freq: Once | INTRAMUSCULAR | Status: AC
  Administered 2019-08-16: 60 mg via INTRAMUSCULAR

## 2019-08-16 MED ORDER — ONDANSETRON HCL 4 MG PO TABS: 4.0000 mg | ORAL_TABLET | Freq: Three times a day (TID) | ORAL | 1 refills | Status: DC | PRN

## 2019-08-16 NOTE — Patient Instructions (Signed)
Preventive Care 40-46 Years Old, Female Preventive care refers to visits with your health care provider and lifestyle choices that can promote health and wellness. This includes:  A yearly physical exam. This may also be called an annual well check.  Regular dental visits and eye exams.  Immunizations.  Screening for certain conditions.  Healthy lifestyle choices, such as eating a healthy diet, getting regular exercise, not using drugs or products that contain nicotine and tobacco, and limiting alcohol use. What can I expect for my preventive care visit? Physical exam Your health care provider will check your:  Height and weight. This may be used to calculate body mass index (BMI), which tells if you are at a healthy weight.  Heart rate and blood pressure.  Skin for abnormal spots. Counseling Your health care provider may ask you questions about your:  Alcohol, tobacco, and drug use.  Emotional well-being.  Home and relationship well-being.  Sexual activity.  Eating habits.  Work and work environment.  Method of birth control.  Menstrual cycle.  Pregnancy history. What immunizations do I need?  Influenza (flu) vaccine  This is recommended every year. Tetanus, diphtheria, and pertussis (Tdap) vaccine  You may need a Td booster every 10 years. Varicella (chickenpox) vaccine  You may need this if you have not been vaccinated. Zoster (shingles) vaccine  You may need this after age 60. Measles, mumps, and rubella (MMR) vaccine  You may need at least one dose of MMR if you were born in 1957 or later. You may also need a second dose. Pneumococcal conjugate (PCV13) vaccine  You may need this if you have certain conditions and were not previously vaccinated. Pneumococcal polysaccharide (PPSV23) vaccine  You may need one or two doses if you smoke cigarettes or if you have certain conditions. Meningococcal conjugate (MenACWY) vaccine  You may need this if you  have certain conditions. Hepatitis A vaccine  You may need this if you have certain conditions or if you travel or work in places where you may be exposed to hepatitis A. Hepatitis B vaccine  You may need this if you have certain conditions or if you travel or work in places where you may be exposed to hepatitis B. Haemophilus influenzae type b (Hib) vaccine  You may need this if you have certain conditions. Human papillomavirus (HPV) vaccine  If recommended by your health care provider, you may need three doses over 6 months. You may receive vaccines as individual doses or as more than one vaccine together in one shot (combination vaccines). Talk with your health care provider about the risks and benefits of combination vaccines. What tests do I need? Blood tests  Lipid and cholesterol levels. These may be checked every 5 years, or more frequently if you are over 50 years old.  Hepatitis C test.  Hepatitis B test. Screening  Lung cancer screening. You may have this screening every year starting at age 55 if you have a 30-pack-year history of smoking and currently smoke or have quit within the past 15 years.  Colorectal cancer screening. All adults should have this screening starting at age 50 and continuing until age 75. Your health care provider may recommend screening at age 45 if you are at increased risk. You will have tests every 1-10 years, depending on your results and the type of screening test.  Diabetes screening. This is done by checking your blood sugar (glucose) after you have not eaten for a while (fasting). You may have this   done every 1-3 years.  Mammogram. This may be done every 1-2 years. Talk with your health care provider about when you should start having regular mammograms. This may depend on whether you have a family history of breast cancer.  BRCA-related cancer screening. This may be done if you have a family history of breast, ovarian, tubal, or peritoneal  cancers.  Pelvic exam and Pap test. This may be done every 3 years starting at age 95. Starting at age 56, this may be done every 5 years if you have a Pap test in combination with an HPV test. Other tests  Sexually transmitted disease (STD) testing.  Bone density scan. This is done to screen for osteoporosis. You may have this scan if you are at high risk for osteoporosis. Follow these instructions at home: Eating and drinking  Eat a diet that includes fresh fruits and vegetables, whole grains, lean protein, and low-fat dairy.  Take vitamin and mineral supplements as recommended by your health care provider.  Do not drink alcohol if: ? Your health care provider tells you not to drink. ? You are pregnant, may be pregnant, or are planning to become pregnant.  If you drink alcohol: ? Limit how much you have to 0-1 drink a day. ? Be aware of how much alcohol is in your drink. In the [place] one drink equals one 12 oz bottle of beer (355 mL), one 5 oz glass of wine (148 mL), or one 1 oz glass of hard liquor (44 mL). Lifestyle  Take daily care of your teeth and gums.  Stay active. Exercise for at least 30 minutes on 5 or more days each week.  Do not use any products that contain nicotine or tobacco, such as cigarettes, e-cigarettes, and chewing tobacco. If you need help quitting, ask your health care provider.  If you are sexually active, practice safe sex. Use a condom or other form of birth control (contraception) in order to prevent pregnancy and STIs (sexually transmitted infections).  If told by your health care provider, take low-dose aspirin daily starting at age 31. What's next?  Visit your health care provider once a year for a well check visit.  Ask your health care provider how often you should have your eyes and teeth checked.  Stay up to date on all vaccines. This information is not intended to replace advice given to you by your health care provider. Make sure you  discuss any questions you have with your health care provider. Document Released: 01/02/2016 Document Revised: 08/17/2018 Document Reviewed: 08/17/2018 Elsevier Patient Education  [employer].  Migraine Headache A migraine headache is an intense, throbbing pain on one side or both sides of the head. Migraine headaches may also cause other symptoms, such as nausea, vomiting, and sensitivity to light and noise. A migraine headache can last from 4 hours to 3 days. Talk with your doctor about what things may bring on (trigger) your migraine headaches. What are the causes? The exact cause of this condition is not known. However, a migraine may be caused when nerves in the brain become irritated and release chemicals that cause inflammation of blood vessels. This inflammation causes pain. This condition may be triggered or caused by:  Drinking alcohol.  Smoking.  Taking medicines, such as: ? Medicine used to treat chest pain (nitroglycerin). ? Birth control pills. ? Estrogen. ? Certain blood pressure medicines.  Eating or drinking products that contain nitrates, glutamate, aspartame, or tyramine. Aged cheeses, chocolate, or caffeine  may also be triggers.  Doing physical activity. Other things that may trigger a migraine headache include:  Menstruation.  Pregnancy.  Hunger.  Stress.  Lack of sleep or too much sleep.  Weather changes.  Fatigue. What increases the risk? The following factors may make you more likely to experience migraine headaches:  Being a certain age. This condition is more common in people who are 28-46 years old.  Being female.  Having a family history of migraine headaches.  Being Caucasian.  Having a mental health condition, such as depression or anxiety.  Being obese. What are the signs or symptoms? The main symptom of this condition is pulsating or throbbing pain. This pain may:  Happen in any area of the head, such as on one side or both  sides.  Interfere with daily activities.  Get worse with physical activity.  Get worse with exposure to bright lights or loud noises. Other symptoms may include:  Nausea.  Vomiting.  Dizziness.  General sensitivity to bright lights, loud noises, or smells. Before you get a migraine headache, you may get warning signs (an aura). An aura may include:  Seeing flashing lights or having blind spots.  Seeing bright spots, halos, or zigzag lines.  Having tunnel vision or blurred vision.  Having numbness or a tingling feeling.  Having trouble talking.  Having muscle weakness. Some people have symptoms after a migraine headache (postdromal phase), such as:  Feeling tired.  Difficulty concentrating. How is this diagnosed? A migraine headache can be diagnosed based on:  Your symptoms.  A physical exam.  Tests, such as: ? CT scan or an MRI of the head. These imaging tests can help rule out other causes of headaches. ? Taking fluid from the spine (lumbar puncture) and analyzing it (cerebrospinal fluid analysis, or CSF analysis). How is this treated? This condition may be treated with medicines that:  Relieve pain.  Relieve nausea.  Prevent migraine headaches. Treatment for this condition may also include:  Acupuncture.  Lifestyle changes like avoiding foods that trigger migraine headaches.  Biofeedback.  Cognitive behavioral therapy. Follow these instructions at home: Medicines  Take over-the-counter and prescription medicines only as told by your health care provider.  Ask your health care provider if the medicine prescribed to you: ? Requires you to avoid driving or using heavy machinery. ? Can cause constipation. You may need to take these actions to prevent or treat constipation:  Drink enough fluid to keep your urine pale yellow.  Take over-the-counter or prescription medicines.  Eat foods that are high in fiber, such as beans, whole grains, and fresh  fruits and vegetables.  Limit foods that are high in fat and processed sugars, such as fried or sweet foods. Lifestyle  Do not drink alcohol.  Do not use any products that contain nicotine or tobacco, such as cigarettes, e-cigarettes, and chewing tobacco. If you need help quitting, ask your health care provider.  Get at least 8 hours of sleep every night.  Find ways to manage stress, such as meditation, deep breathing, or yoga. General instructions      Keep a journal to find out what may trigger your migraine headaches. For example, write down: ? What you eat and drink. ? How much sleep you get. ? Any change to your diet or medicines.  If you have a migraine headache: ? Avoid things that make your symptoms worse, such as bright lights. ? It may help to lie down in a dark, quiet room. ?  Do not drive or use heavy machinery. ? Ask your health care provider what activities are safe for you while you are experiencing symptoms.  Keep all follow-up visits as told by your health care provider. This is important. Contact a health care provider if:  You develop symptoms that are different or more severe than your usual migraine headache symptoms.  You have more than 15 headache days in one month. Get help right away if:  Your migraine headache becomes severe.  Your migraine headache lasts longer than 72 hours.  You have a fever.  You have a stiff neck.  You have vision loss.  Your muscles feel weak or like you cannot control them.  You start to lose your balance often.  You have trouble walking.  You faint.  You have a seizure. Summary  A migraine headache is an intense, throbbing pain on one side or both sides of the head. Migraines may also cause other symptoms, such as nausea, vomiting, and sensitivity to light and noise.  This condition may be treated with medicines and lifestyle changes. You may also need to avoid certain things that trigger a migraine headache.   Keep a journal to find out what may trigger your migraine headaches.  Contact your health care provider if you have more than 15 headache days in a month or you develop symptoms that are different or more severe than your usual migraine headache symptoms. This information is not intended to replace advice given to you by your health care provider. Make sure you discuss any questions you have with your health care provider. Document Released: 12/06/2005 Document Revised: 03/30/2019 Document Reviewed: 01/18/2019 Elsevier Patient Education  [employer].  Urinary Frequency, Adult Urinary frequency means urinating more often than usual. You may urinate every 1-2 hours even though you drink a normal amount of fluid and do not have a bladder infection or condition. Although you urinate more often than normal, the total amount of urine produced in a day is normal. With urinary frequency, you may have an urgent need to urinate often. The stress and anxiety of needing to find a bathroom quickly can make this urge worse. This condition may go away on its own or you may need treatment at home. Home treatment may include bladder training, exercises, taking medicines, or making changes to your diet. Follow these instructions at home: Bladder health   Keep a bladder diary if told by your health care provider. Keep track of: ? What you eat and drink. ? How often you urinate. ? How much you urinate.  Follow a bladder training program if told by your health care provider. This may include: ? Learning to delay going to the bathroom. ? Double urinating (voiding). This helps if you are not completely emptying your bladder. ? Scheduled voiding.  Do Kegel exercises as told by your health care provider. Kegel exercises strengthen the muscles that help control urination, which may help the condition. Eating and drinking  If told by your health care provider, make diet changes, such as: ? Avoiding  caffeine. ? Drinking fewer fluids, especially alcohol. ? Not drinking in the evening. ? Avoiding foods or drinks that may irritate the bladder. These include coffee, tea, soda, artificial sweeteners, citrus, tomato-based foods, and chocolate. ? Eating foods that help prevent or ease constipation. Constipation can make this condition worse. Your health care provider may recommend that you:  Drink enough fluid to keep your urine pale yellow.  Take over-the-counter or prescription  medicines.  Eat foods that are high in fiber, such as beans, whole grains, and fresh fruits and vegetables.  Limit foods that are high in fat and processed sugars, such as fried or sweet foods. General instructions  Take over-the-counter and prescription medicines only as told by your health care provider.  Keep all follow-up visits as told by your health care provider. This is important. Contact a health care provider if:  You start urinating more often.  You feel pain or irritation when you urinate.  You notice blood in your urine.  Your urine looks cloudy.  You develop a fever.  You begin vomiting. Get help right away if:  You are unable to urinate. Summary  Urinary frequency means urinating more often than usual. With urinary frequency, you may urinate every 1-2 hours even though you drink a normal amount of fluid and do not have a bladder infection or other bladder condition.  Your health care provider may recommend that you keep a bladder diary, follow a bladder training program, or make dietary changes.  If told by your health care provider, do Kegel exercises to strengthen the muscles that help control urination.  Take over-the-counter and prescription medicines only as told by your health care provider.  Contact a health care provider if your symptoms do not improve or get worse. This information is not intended to replace advice given to you by your health care provider. Make sure you  discuss any questions you have with your health care provider. Document Released: 10/02/2009 Document Revised: 06/15/2018 Document Reviewed: 06/15/2018 Elsevier Patient Education  2020 [employer].

## 2019-08-16 NOTE — Progress Notes (Signed)
Subjective:     [name] is a 48 y.o. female and is here for a comprehensive physical exam. The patient reports several problems. Pt notes having increased thirst, feeling shaky, and urinary frequency x 1 yr.  Pt had a h/o gDM and notes family hx of DM.  Urinary frequency is chronic, s/p urodynamic testing, offered mesh.  Pt also having a migraine.  Pain behind L eye, sensitivity to light, and nausea.  Social hx:  Pt is a Musician and an interpreter.  Pt endorses increased stress at work.  She is being considered for a promotion.  Also notes stress of kids starting virtual learning.  Social History   Socioeconomic History  . Marital status: Married    Spouse name: Not on file  . Number of children: Not on file  . Years of education: Not on file  . Highest education level: Not on file  Occupational History  . Not on file  Social Needs  . Financial resource strain: Not on file  . Food insecurity    Worry: Not on file    Inability: Not on file  . Transportation needs    Medical: Not on file    Non-medical: Not on file  Tobacco Use  . Smoking status: Never Smoker  . Smokeless tobacco: Never Used  Substance and Sexual Activity  . Alcohol use: No  . Drug use: No  . Sexual activity: Not on file    Comment: BTL  Lifestyle  . Physical activity    Days per week: Not on file    Minutes per session: Not on file  . Stress: Not on file  Relationships  . Social Herbalist on phone: Not on file    Gets together: Not on file    Attends religious service: Not on file    Active member of club or organization: Not on file    Attends meetings of clubs or organizations: Not on file    Relationship status: Not on file  . Intimate partner violence    Fear of current or ex partner: Not on file    Emotionally abused: Not on file    Physically abused: Not on file    Forced sexual activity: Not on file  Other Topics Concern  . Not on file  Social History Narrative   Work or School: [employer], [employer] Situation: lives with husband and 3 kids (36, 62 and 73 yo in 2015)      Spiritual Beliefs: Christian      Lifestyle: no regular CV exercise; fair            Health Maintenance  Topic Date Due  . INFLUENZA VACCINE  07/21/2019  . TETANUS/TDAP  12/21/2019  . PAP SMEAR-Modifier  05/19/2020  . HIV Screening  Completed    The following portions of the patient's history were reviewed and updated as appropriate: allergies, current medications, past family history, past medical history, past social history, past surgical history and problem list.  Review of Systems Pertinent items noted in HPI and remainder of comprehensive ROS otherwise negative.   Objective:    BP 98/72 (BP Location: Left Arm, Patient Position: Sitting, Cuff Size: Normal)   Pulse 74   Temp (!) 97.2 F (36.2 C)   Wt 146 lb (66.2 kg)   LMP 07/25/2019 (Exact Date)   SpO2 98%   BMI 25.86 kg/m  General appearance: alert, cooperative and mild  distress, mildly uncomfortable.  Lights in room turned off. Head: Normocephalic, without obvious abnormality, atraumatic Eyes: conjunctivae/corneas clear. PERRL, EOM's intact. Fundi benign. Ears: normal TM's and external ear canals both ears Nose: Nares normal. Septum midline. Mucosa normal. No drainage or sinus tenderness. Throat: lips, mucosa, and tongue normal; teeth and gums normal Neck: no adenopathy, no carotid bruit, no JVD, supple, symmetrical, trachea midline and thyroid not enlarged, symmetric, no tenderness/mass/nodules Lungs: clear to auscultation bilaterally Heart: regular rate and rhythm, S1, S2 normal, no murmur, click, rub or gallop Abdomen: soft, non-tender; bowel sounds normal; no masses,  no organomegaly Extremities: extremities normal, atraumatic, no cyanosis or edema Pulses: 2+ and symmetric Skin: Skin color, texture, turgor normal. No rashes or lesions Lymph nodes: Cervical, supraclavicular, and  axillary nodes normal. Neurologic: Alert and oriented X 3, normal strength and tone. Normal symmetric reflexes. Normal coordination and gait    Assessment:    Healthy female exam with current migraine headache and several concerns.     Plan:     Anticipatory guidance given including wearing seatbelts, smoke detectors in the home, increasing physical activity, increasing p.o. intake of water and vegetables. -will obtain labs -pap done by OB/Gyn -pt to schedule mammogram -given handout -offered influenza vaccine.   See After Visit Summary for Counseling Recommendations    Migraine without aura and without status migrainosus, not intractable -discussed HA prevention -given Toradol in clinic -given handout -pt given note for work  - Plan: ketorolac (TORADOL) injection 60 mg, CBC with Differential/Platelet, TSH, T4, Free, Comprehensive metabolic panel, ondansetron (ZOFRAN) 4 MG tablet  Urinary frequency  - Plan: Hemoglobin A1c, POCT urinalysis dipstick  Stress -discussed ways to reduce stress -given handout  Screening for cholesterol level  - Plan: Lipid panel  F/u prn  Grier Mitts, MD

## 2019-08-20 ENCOUNTER — Encounter: Payer: Self-pay | Admitting: Family Medicine

## 2019-11-02 ENCOUNTER — Other Ambulatory Visit (HOSPITAL_COMMUNITY): Payer: Self-pay | Admitting: Plastic Surgery

## 2019-11-02 ENCOUNTER — Other Ambulatory Visit: Payer: Self-pay

## 2019-11-02 ENCOUNTER — Ambulatory Visit (HOSPITAL_COMMUNITY)
Admission: RE | Admit: 2019-11-02 | Discharge: 2019-11-02 | Disposition: A | Discharge status: Home / Self Care | Payer: 59 | Source: Ambulatory Visit | Attending: Plastic Surgery | Admitting: Plastic Surgery

## 2019-11-02 ENCOUNTER — Other Ambulatory Visit: Payer: Self-pay | Admitting: Plastic Surgery

## 2019-11-02 DIAGNOSIS — Z9862 Peripheral vascular angioplasty status: Secondary | ICD-10-CM | POA: Diagnosis not present

## 2019-11-02 MED ORDER — IOHEXOL 300 MG/ML  SOLN
100.0000 mL | Freq: Once | INTRAMUSCULAR | Status: AC | PRN
  Administered 2019-11-02: 100 mL via INTRAVENOUS

## 2019-11-05 ENCOUNTER — Ambulatory Visit (HOSPITAL_COMMUNITY): Payer: 59

## 2020-08-01 ENCOUNTER — Ambulatory Visit: Payer: 59 | Admitting: Family Medicine

## 2020-08-01 ENCOUNTER — Encounter: Payer: Self-pay | Admitting: Family Medicine

## 2020-08-01 ENCOUNTER — Other Ambulatory Visit: Payer: Self-pay

## 2020-08-01 VITALS — BP 98/70 | HR 98 | Temp 98.8°F | Wt 142.0 lb

## 2020-08-01 DIAGNOSIS — B372 Candidiasis of skin and nail: Secondary | ICD-10-CM

## 2020-08-01 MED ORDER — NYSTATIN 100000 UNIT/GM EX CREA: 1.0000 "application " | TOPICAL_CREAM | Freq: Two times a day (BID) | CUTANEOUS | 0 refills | Status: DC

## 2020-08-01 MED ORDER — NYSTATIN 100000 UNIT/GM EX CREA: 1.0000 "application " | TOPICAL_CREAM | Freq: Two times a day (BID) | CUTANEOUS | 1 refills | Status: DC

## 2020-08-01 NOTE — Patient Instructions (Signed)

## 2020-08-01 NOTE — Progress Notes (Signed)
Subjective:    Patient ID: [name], female    DOB: 1971-11-28, 49 y.o.   MRN: [id number]  No chief complaint on file.   HPI Patient was seen today for acute concern.  Ptt endorses itching, irritation, and rash underneath breast x 2.5-3 wks.  Pt using cornstarch underneath breast and vitamin E oil which helps some.  Pt notes similar rash has happened once before during the summer.  Pt exercising more.    Past Medical History:  Diagnosis Date  . Abnormal Pap smear 2004  . Dyspareunia   . History of fainting spells of unknown cause   . Migraines   . Palpitations   . Pelvic pain in female   . Post - coital bleeding   . Post partum depression   . Scoliosis   . Yeast vaginitis    recurrent     Allergies  Allergen Reactions  . Hydrocodone Nausea Only and Other (See Comments)    dizziness  . Tramadol Nausea And Vomiting and Other (See Comments)    dizziness    ROS General: Denies fever, chills, night sweats, changes in weight, changes in appetite HEENT: Denies headaches, ear pain, changes in vision, rhinorrhea, sore throat CV: Denies CP, palpitations, SOB, orthopnea Pulm: Denies SOB, cough, wheezing GI: Denies abdominal pain, nausea, vomiting, diarrhea, constipation GU: Denies dysuria, hematuria, frequency, vaginal discharge Msk: Denies muscle cramps, joint pains Neuro: Denies weakness, numbness, tingling  Skin: Denies bruising  +rash underneath b/l breast Psych: Denies depression, anxiety, hallucinations     Objective:    There were no vitals taken for this visit. Weight 142.4 LBS, temp 98.8, BP 98/70, pulse 98, pO2 98  Gen. Pleasant, well-nourished, in no distress, normal affect   HEENT: [place]/AT, face symmetric, conjunctiva clear, no scleral icterus, PERRLA, EOMI, nares patent without drainage Lungs: no accessory muscle use Cardiovascular: RRR, no peripheral edema Neuro:  A&Ox3, CN II-XII intact, normal gait Skin:  Warm, dry, intact.  Hyperpigmentation  underneath b/l breasts with satellite lesions at periphery.  L breast with a healing abscess, no drainage noted.  Wt Readings from Last 3 Encounters:  08/16/19 146 lb (66.2 kg)  09/08/18 146 lb (66.2 kg)  09/13/17 147 lb 8 oz (66.9 kg)    Lab Results  Component Value Date   WBC 4.5 08/16/2019   HGB 13.3 08/16/2019   HCT 39.8 08/16/2019   PLT 303.0 08/16/2019   GLUCOSE 86 08/16/2019   CHOL 166 08/16/2019   TRIG 141.0 08/16/2019   HDL 40.80 08/16/2019   LDLDIRECT 74.4 08/29/2014   LDLCALC 97 08/16/2019   ALT 11 08/16/2019   AST 12 08/16/2019   NA 137 08/16/2019   K 4.3 08/16/2019   CL 102 08/16/2019   CREATININE 0.83 08/16/2019   BUN 7 08/16/2019   CO2 28 08/16/2019   TSH 3.58 08/16/2019   HGBA1C 5.6 08/16/2019    Assessment/Plan:  Yeast dermatitis  -discussed prevention -given handout -given handout - Plan: nystatin cream (MYCOSTATIN)  F/u prn  Grier Mitts, MD

## 2020-08-13 ENCOUNTER — Other Ambulatory Visit: Payer: Self-pay

## 2020-08-13 DIAGNOSIS — Z20822 Contact with and (suspected) exposure to covid-19: Secondary | ICD-10-CM

## 2020-08-15 LAB — SARS-COV-2, NAA 2 DAY TAT

## 2020-08-15 LAB — NOVEL CORONAVIRUS, NAA: SARS-CoV-2, NAA: NOT DETECTED

## 2020-10-09 IMAGING — CT CT ABD-PELV W/ CM
2 of 5 series · 15 of 46 positions shown, 17 images · IV contrast (APPLIED)
Comparison: None.

CLINICAL DATA: History of tummy tuck surgery 2 weeks ago. One of
the Jackson-Pratt drainage catheters could not be removed.

EXAM:
CT ABDOMEN AND PELVIS WITH CONTRAST
TECHNIQUE: Multidetector CT imaging of the abdomen and pelvis was performed
using the standard protocol following bolus administration of
intravenous contrast.
CONTRAST:  100mL OMNIPAQUE IOHEXOL 300 MG/ML  SOLN

[Series 4: axial st · axial · 0.70mm/px · z∈[-256,+124]mm · 12 of 88 slices shown, 14 images]
[im 6/88  soft-tissue]
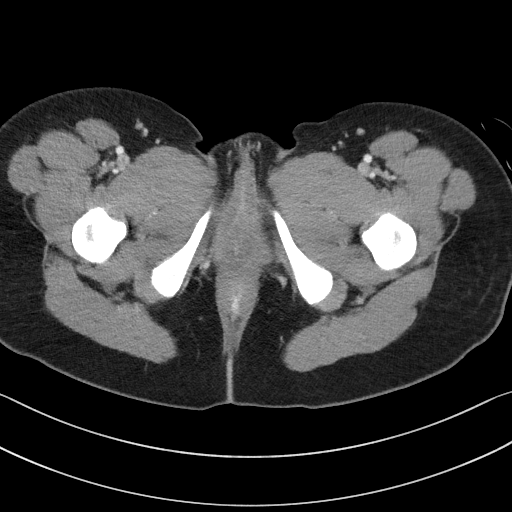
[im 6/88  bone]
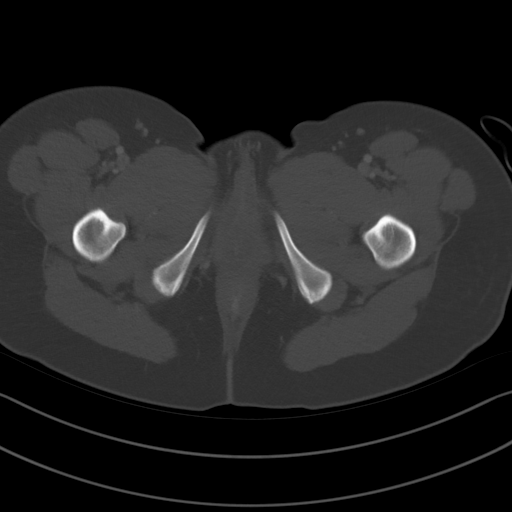
[im 12/88  soft-tissue]
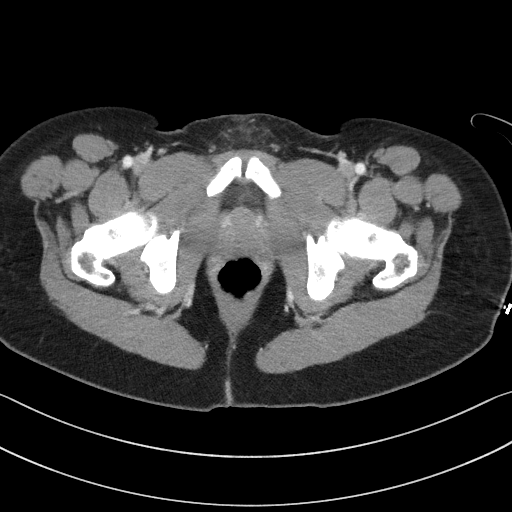
[im 18/88  soft-tissue]
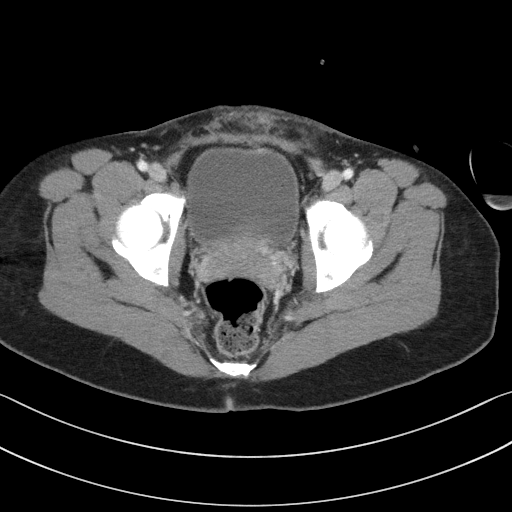
[im 30/88  soft-tissue]
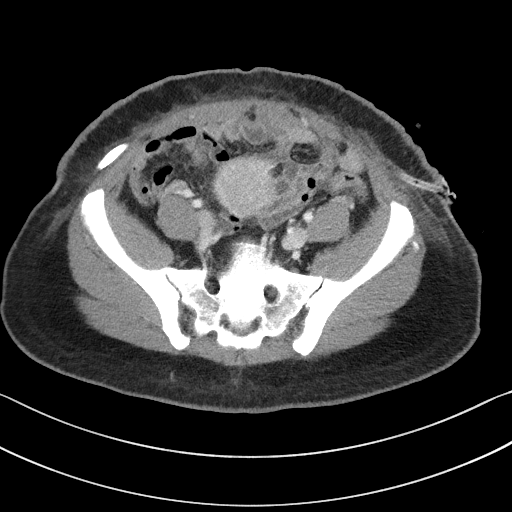
[im 35/88  soft-tissue]
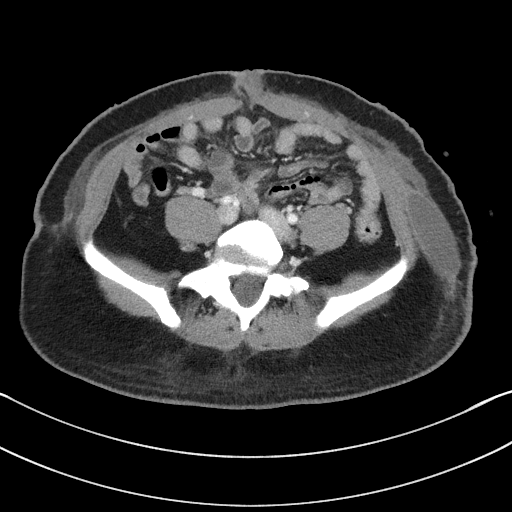
[im 41/88  soft-tissue]
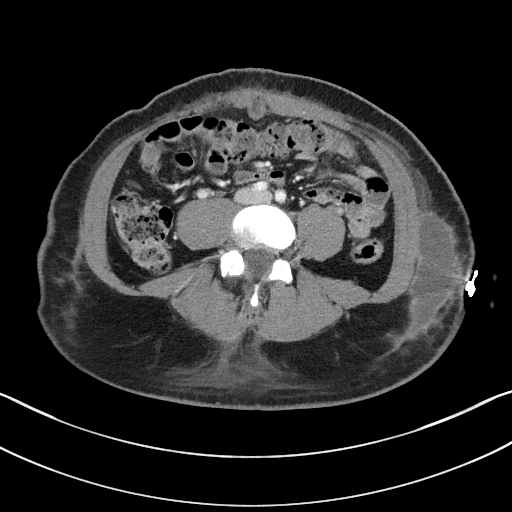
[im 47/88  soft-tissue]
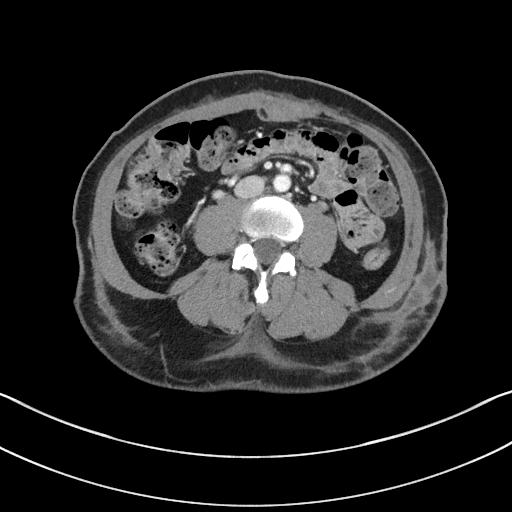
[im 53/88  soft-tissue]
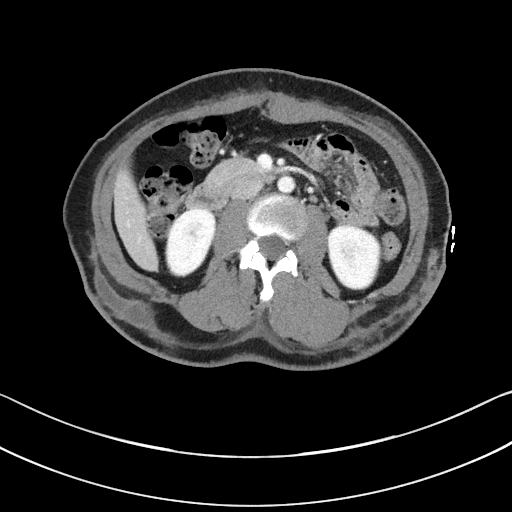
[im 59/88  soft-tissue]
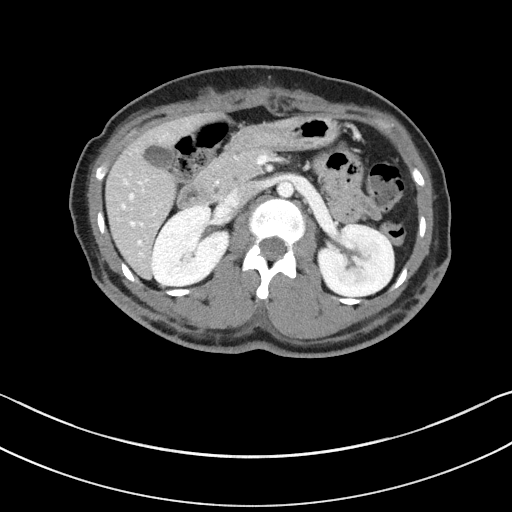
[im 59/88  bone]
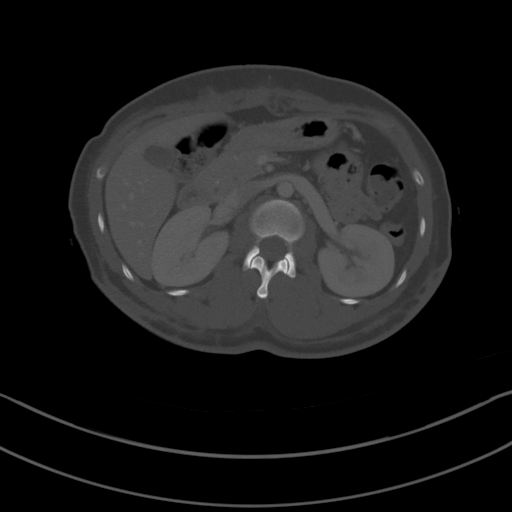
[im 70/88  soft-tissue]
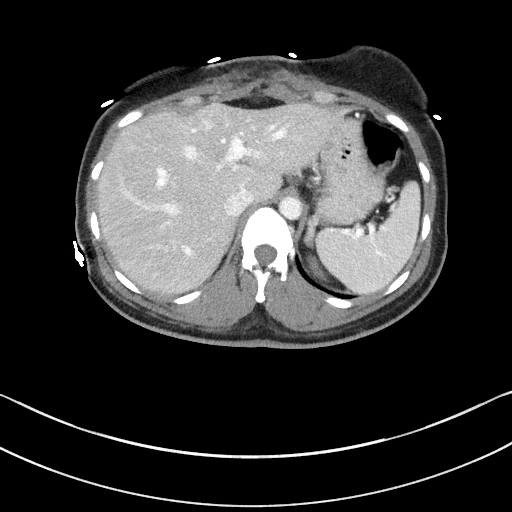
[im 76/88  soft-tissue]
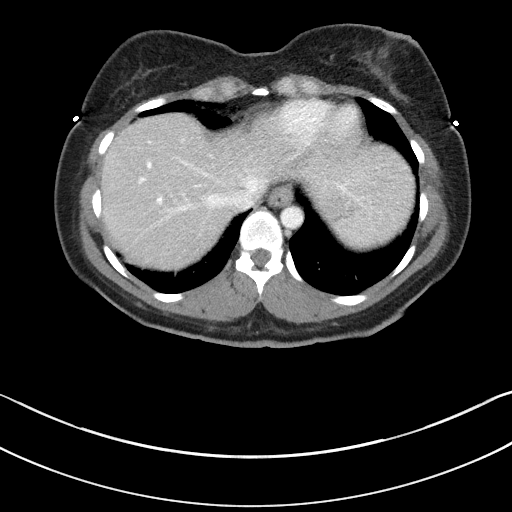
[im 82/88  soft-tissue]
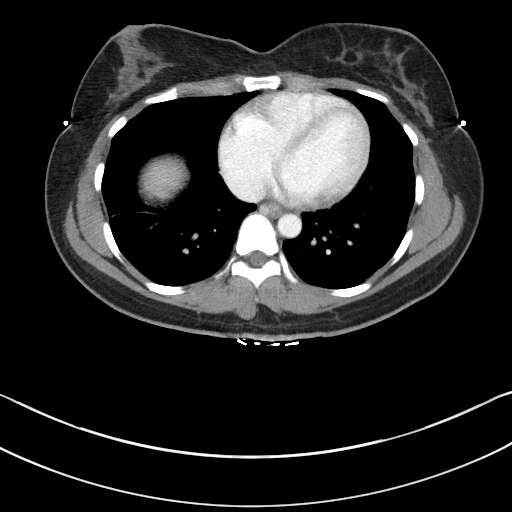

[Series 6: coronal st · coronal · 0.68mm/px · 3 of 87 slices shown]
[im 29/87  soft-tissue]
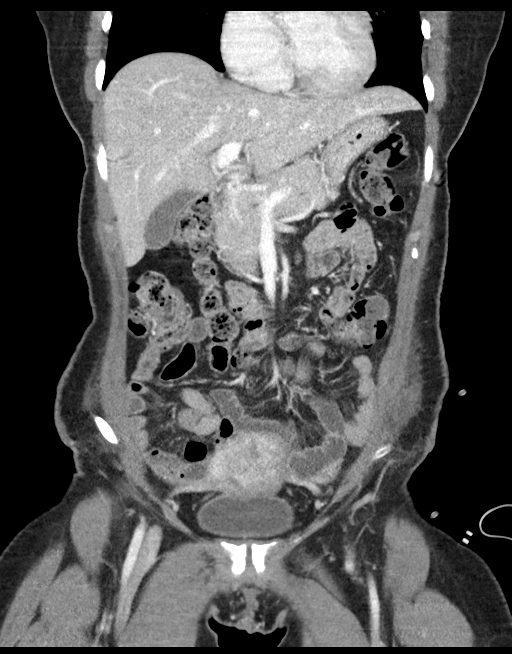
[im 39/87  soft-tissue]
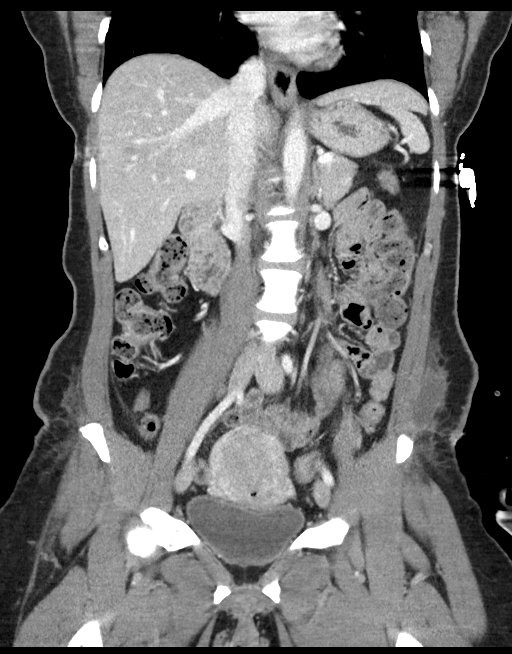
[im 48/87  soft-tissue]
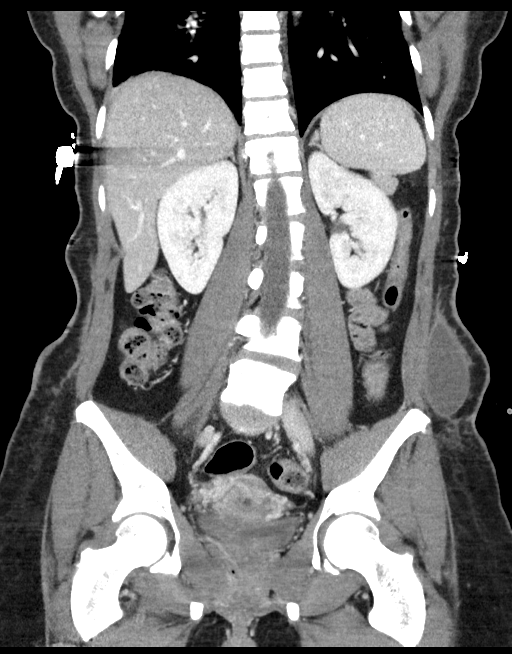

[15 of 46 positions shown; findings below may reference images not displayed]

FINDINGS: Lower chest: Minimal streaky dependent basilar atelectasis. No
infiltrates or effusions. No worrisome pulmonary lesions. The heart
is normal in size. No pericardial effusion. The distal esophagus is
grossly normal.

Hepatobiliary: No focal hepatic lesions or intrahepatic biliary
dilatation. Gallbladder is normal. No common bile duct dilatation.
The portal and hepatic veins are normal.

Pancreas: No mass, inflammation or ductal dilatation.

Spleen: Normal size.  No focal lesions.

Adrenals/Urinary Tract: The adrenal glands and kidneys are
unremarkable. No renal, ureteral or bladder calculi or mass.

Stomach/Bowel: The stomach, duodenum, small bowel and colon are
unremarkable. No acute inflammatory changes, mass lesions or
obstructive findings.

Vascular/Lymphatic: The aorta is normal in caliber. No dissection.
The branch vessels are patent. The major venous structures are
patent. No mesenteric or retroperitoneal mass or adenopathy. Small
scattered lymph nodes are noted.

Reproductive: The uterus and ovaries are unremarkable. Prominent
parametrial vessels may suggest pelvic congestion syndrome.

Other: There are surgical changes from recent abdominal wall surgery
and liposuction. There are fluid collections in the subcutaneous fat
in both lower flank areas, left greater than right. The left-sided
collection measures approximately 9 x 3 cm and the right-sided
collection measures 6.5 x 1.8 cm. These are likely liquified
hematoma is or postop seromas.

There is a left-sided Jackson-Pratt catheter which crosses the
midline at the level of the suprapubic portion of the pelvis and tip
is near the right iliac crest. The catheter is clearly superficial
to the abdominal wall musculature and I do not see any obvious
kinking or looping or obvious fixation to explain why the catheter
cannot be removed. The catheter appears intact/contiguous.

Musculoskeletal: No significant bony findings.
IMPRESSION: 1. Left-sided Jackson-Pratt drainage catheter appears to be in good
position in the subcutaneous fat of the lower abdomen superficial to
the anterior abdominal wall musculature and no obvious kinking,
looping or fixating point or breakage.
2. Expected postoperative changes involving the subcutaneous fat of
the lower abdomen and flank areas.
3. No significant intra-abdominal/intrapelvic abnormalities.

## 2021-07-06 ENCOUNTER — Ambulatory Visit: Payer: 59 | Admitting: Family Medicine

## 2021-07-06 ENCOUNTER — Encounter: Payer: Self-pay | Admitting: Family Medicine

## 2021-07-06 ENCOUNTER — Other Ambulatory Visit: Payer: Self-pay

## 2021-07-06 VITALS — BP 110/72 | HR 88 | Temp 98.3°F | Wt 141.8 lb

## 2021-07-06 DIAGNOSIS — B372 Candidiasis of skin and nail: Secondary | ICD-10-CM

## 2021-07-06 MED ORDER — NYSTATIN 100000 UNIT/GM EX POWD
1.0000 | Freq: Three times a day (TID) | CUTANEOUS | 0 refills | Status: DC
Start: 2021-07-06 — End: 2021-10-28

## 2021-07-06 NOTE — Progress Notes (Signed)
Subjective:    Patient ID: [name], female    DOB: 1971/03/12, 50 y.o.   MRN: [id number]  Chief Complaint  Patient presents with   Rash    Under breast since 7/2 or 7/3. Has not tried anything.    HPI Patient was seen today for acute concern.  Pt endorses faint pruritic and at times erythematous rash underneath bilateral breasts.  Endorses similar rash during summer months each year.  Area becomes hyperpigmented then returns to normal appearing skin.  Patient has not tried anything for symptoms.  Denies changes in soaps, lotions, detergents.  Endorses mild sweating in the bilateral breasts.  Trying to increase intake of water.  Patient inquires about correlation between DM and yeast infections.  Followed by OB/GYN for woman exams.  Past Medical History:  Diagnosis Date   Abnormal Pap smear 2004   Dyspareunia    History of fainting spells of unknown cause    Migraines    Palpitations    Pelvic pain in female    Post - coital bleeding    Post partum depression    Scoliosis    Yeast vaginitis    recurrent     Allergies  Allergen Reactions   Hydrocodone Nausea Only and Other (See Comments)    dizziness   Tramadol Nausea And Vomiting and Other (See Comments)    dizziness    ROS General: Denies fever, chills, night sweats, changes in weight, changes in appetite HEENT: Denies headaches, ear pain, changes in vision, rhinorrhea, sore throat CV: Denies CP, palpitations, SOB, orthopnea Pulm: Denies SOB, cough, wheezing GI: Denies abdominal pain, nausea, vomiting, diarrhea, constipation GU: Denies dysuria, hematuria, frequency, vaginal discharge Msk: Denies muscle cramps, joint pains Neuro: Denies weakness, numbness, tingling Skin: Denies rashes, bruising + improving rash under bilateral breast Psych: Denies depression, anxiety, hallucinations    Objective:    Blood pressure 110/72, pulse 88, temperature 98.3 F (36.8 C), temperature source Oral, weight 141 lb 12.8 oz  (64.3 kg), SpO2 98 %.  Gen. Pleasant, well-nourished, in no distress, normal affect   HEENT: [place]/AT, face symmetric, conjunctiva clear, no scleral icterus, PERRLA, EOMI, nares patent without drainage Lungs: no accessory muscle use Cardiovascular: RRR, no peripheral edema Musculoskeletal: No deformities, no cyanosis or clubbing, normal tone Neuro:  A&Ox3, CN II-XII intact, normal gait Skin:  Warm, dry, intact.  Inferior to b/l folds and mammary tissue with mild hyperpigmentation and faint flesh colored papules, no erythema present.   Wt Readings from Last 3 Encounters:  07/06/21 141 lb 12.8 oz (64.3 kg)  08/01/20 142 lb (64.4 kg)  08/16/19 146 lb (66.2 kg)    Lab Results  Component Value Date   WBC 4.5 08/16/2019   HGB 13.3 08/16/2019   HCT 39.8 08/16/2019   PLT 303.0 08/16/2019   GLUCOSE 86 08/16/2019   CHOL 166 08/16/2019   TRIG 141.0 08/16/2019   HDL 40.80 08/16/2019   LDLDIRECT 74.4 08/29/2014   LDLCALC 97 08/16/2019   ALT 11 08/16/2019   AST 12 08/16/2019   NA 137 08/16/2019   K 4.3 08/16/2019   CL 102 08/16/2019   CREATININE 0.83 08/16/2019   BUN 7 08/16/2019   CO2 28 08/16/2019   TSH 3.58 08/16/2019   HGBA1C 5.6 08/16/2019    Assessment/Plan:  Candidal dermatitis -Discussed keeping skin clean and dry.  Consider moisture wicking fabrics - Plan: nystatin (MYCOSTATIN/NYSTOP) powder  F/u in the next few weeks for CPE  Grier Mitts, MD

## 2021-07-27 ENCOUNTER — Encounter: Payer: 59 | Admitting: Family Medicine

## 2021-08-10 ENCOUNTER — Telehealth: Payer: Self-pay | Admitting: Family Medicine

## 2021-08-10 NOTE — Telephone Encounter (Signed)
Patient called in to dispute a no show fee that she thought came from a cancelled appointment with Dr. Volanda Napoleon. I researched to discover that this no show fee was from [facility]. I called the patient and gave her the contact information for that office.

## 2021-10-15 ENCOUNTER — Ambulatory Visit: Payer: 59 | Admitting: Family Medicine

## 2021-10-15 ENCOUNTER — Other Ambulatory Visit: Payer: Self-pay

## 2021-10-15 ENCOUNTER — Ambulatory Visit (INDEPENDENT_AMBULATORY_CARE_PROVIDER_SITE_OTHER): Payer: 59

## 2021-10-15 VITALS — BP 118/68 | HR 95 | Temp 98.6°F | Wt 143.6 lb

## 2021-10-15 DIAGNOSIS — M20031 Swan-neck deformity of right finger(s): Secondary | ICD-10-CM | POA: Diagnosis not present

## 2021-10-15 DIAGNOSIS — G8929 Other chronic pain: Secondary | ICD-10-CM

## 2021-10-15 DIAGNOSIS — M2550 Pain in unspecified joint: Secondary | ICD-10-CM

## 2021-10-15 DIAGNOSIS — M419 Scoliosis, unspecified: Secondary | ICD-10-CM

## 2021-10-15 NOTE — Patient Instructions (Signed)
You can use Voltaren gel on your hips and knees for the pain.  It can be found over the counter at your local drug store.

## 2021-10-15 NOTE — Progress Notes (Signed)
Subjective:    Patient ID: [name], female    DOB: [birth year], 50 y.o.   MRN: [id number]  Chief Complaint  Patient presents with   Pain    Back pain, started back in august. Back, knee and toe pain but overall bone pain in general. Has taken/ takes tylenol arthritis strength    HPI Patient was seen today for chronic concern.  Patient with back pain.  Worse in upper neck and back with cold weather.  Patient also endorses throbbing pain in right great toe, right knee pain, and right hip locking making it difficult to sit or stand.  Inquires about osteoporosis as her mom and older sisters have it.  One of pt's sisters also has Turner syndrome.    Past Medical History:  Diagnosis Date   Abnormal Pap smear 2004   Dyspareunia    History of fainting spells of unknown cause    Migraines    Palpitations    Pelvic pain in female    Post - coital bleeding    Post partum depression    Scoliosis    Yeast vaginitis    recurrent     Allergies  Allergen Reactions   Hydrocodone Nausea Only and Other (See Comments)    dizziness   Tramadol Nausea And Vomiting and Other (See Comments)    dizziness    ROS General: Denies fever, chills, night sweats, changes in weight, changes in appetite HEENT: Denies headaches, ear pain, changes in vision, rhinorrhea, sore throat CV: Denies CP, palpitations, SOB, orthopnea Pulm: Denies SOB, cough, wheezing GI: Denies abdominal pain, nausea, vomiting, diarrhea, constipation GU: Denies dysuria, hematuria, frequency, vaginal discharge Msk: Denies muscle cramps, joint pains  + pain in multiple joints Neuro: Denies weakness, numbness, tingling Skin: Denies rashes, bruising Psych: Denies depression, anxiety, hallucinations     Objective:    Blood pressure 118/68, pulse 95, temperature 98.6 F (37 C), temperature source Oral, weight 143 lb 9.6 oz (65.1 kg), SpO2 99 %.   Gen. Pleasant, well-nourished, in no distress, normal affect   HEENT: [place]/AT,  face symmetric, conjunctiva clear, no scleral icterus, PERRLA, EOMI, nares patent without drainage, pharynx without erythema or exudate. 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  Scoliosis of spine noted.  No TTP of bilateral hips or lateral thighs.  No cyanosis or clubbing, normal tone Neuro:  A&Ox3, CN II-XII intact, normal gait Skin:  Warm, no lesions/ rash   Wt Readings from Last 3 Encounters:  10/15/21 143 lb 9.6 oz (65.1 kg)  07/06/21 141 lb 12.8 oz (64.3 kg)  08/01/20 142 lb (64.4 kg)    Lab Results  Component Value Date   WBC 4.5 08/16/2019   HGB 13.3 08/16/2019   HCT 39.8 08/16/2019   PLT 303.0 08/16/2019   GLUCOSE 86 08/16/2019   CHOL 166 08/16/2019   TRIG 141.0 08/16/2019   HDL 40.80 08/16/2019   LDLDIRECT 74.4 08/29/2014   LDLCALC 97 08/16/2019   ALT 11 08/16/2019   AST 12 08/16/2019   NA 137 08/16/2019   K 4.3 08/16/2019   CL 102 08/16/2019   CREATININE 0.83 08/16/2019   BUN 7 08/16/2019   CO2 28 08/16/2019   TSH 3.58 08/16/2019   HGBA1C 5.6 08/16/2019    Assessment/Plan:  Chronic pain of multiple joints -discussed possible causes including OA, autoimmune d/o -  will obtain imaging -discussed supportive care including topical analgesics such as Voltaren gel.  Can also use heat, stretching, and massage.  Consider PT and acupuncture. 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

## 2021-10-16 LAB — CBC WITH DIFFERENTIAL/PLATELET
Basophils Absolute: 0 10*3/uL (ref 0.0–0.1)
Basophils Relative: 0.8 % (ref 0.0–3.0)
Eosinophils Absolute: 0 10*3/uL (ref 0.0–0.7)
Eosinophils Relative: 0.9 % (ref 0.0–5.0)
HCT: 38.2 % (ref 36.0–46.0)
Hemoglobin: 12.8 g/dL (ref 12.0–15.0)
Lymphocytes Relative: 38.5 % (ref 12.0–46.0)
Lymphs Abs: 1.9 10*3/uL (ref 0.7–4.0)
MCHC: 33.4 g/dL (ref 30.0–36.0)
MCV: 93.2 fl (ref 78.0–100.0)
Monocytes Absolute: 0.4 10*3/uL (ref 0.1–1.0)
Monocytes Relative: 7.9 % (ref 3.0–12.0)
Neutro Abs: 2.5 10*3/uL (ref 1.4–7.7)
Neutrophils Relative %: 51.9 % (ref 43.0–77.0)
Platelets: 352 10*3/uL (ref 150.0–400.0)
RBC: 4.1 Mil/uL (ref 3.87–5.11)
RDW: 13.3 % (ref 11.5–15.5)
WBC: 4.8 10*3/uL (ref 4.0–10.5)

## 2021-10-16 LAB — COMPREHENSIVE METABOLIC PANEL
ALT: 11 U/L (ref 0–35)
AST: 15 U/L (ref 0–37)
Albumin: 4.3 g/dL (ref 3.5–5.2)
Alkaline Phosphatase: 54 U/L (ref 39–117)
BUN: 10 mg/dL (ref 6–23)
CO2: 27 mEq/L (ref 19–32)
Calcium: 9.3 mg/dL (ref 8.4–10.5)
Chloride: 104 mEq/L (ref 96–112)
Creatinine, Ser: 0.87 mg/dL (ref 0.40–1.20)
GFR: 78.6 mL/min (ref >60.00)
Glucose, Bld: 98 mg/dL (ref 70–99)
Potassium: 3.2 mEq/L — ABNORMAL LOW (ref 3.5–5.1)
Sodium: 140 mEq/L (ref 135–145)
Total Bilirubin: 0.6 mg/dL (ref 0.2–1.2)
Total Protein: 7.2 g/dL (ref 6.0–8.3)

## 2021-10-16 LAB — SEDIMENTATION RATE: Sed Rate: 8 mm/hr (ref 0–20)

## 2021-10-16 LAB — URIC ACID: Uric Acid, Serum: 4.5 mg/dL (ref 2.4–7.0)

## 2021-10-17 ENCOUNTER — Other Ambulatory Visit: Payer: Self-pay | Admitting: Family Medicine

## 2021-10-17 DIAGNOSIS — E876 Hypokalemia: Secondary | ICD-10-CM

## 2021-10-17 LAB — RHEUMATOID FACTOR: Rhuematoid fact SerPl-aCnc: <14 IU/mL (ref <14)

## 2021-10-17 MED ORDER — POTASSIUM CHLORIDE CRYS ER 20 MEQ PO TBCR: 20.0000 meq | EXTENDED_RELEASE_TABLET | Freq: Two times a day (BID) | ORAL | 0 refills | Status: DC

## 2021-10-21 ENCOUNTER — Ambulatory Visit: Payer: 59 | Admitting: Family Medicine

## 2021-10-22 ENCOUNTER — Telehealth: Payer: Self-pay

## 2021-10-22 NOTE — Telephone Encounter (Signed)
Patient called and asked that future Rx refills be sent to [facility]

## 2021-10-25 LAB — HM PAP SMEAR

## 2021-10-28 ENCOUNTER — Encounter: Payer: Self-pay | Admitting: Family Medicine

## 2022-01-15 ENCOUNTER — Telehealth (INDEPENDENT_AMBULATORY_CARE_PROVIDER_SITE_OTHER): Payer: 59 | Admitting: Family Medicine

## 2022-01-15 ENCOUNTER — Encounter: Payer: Self-pay | Admitting: Family Medicine

## 2022-01-15 VITALS — BP 107/77 | HR 108 | Wt 139.0 lb

## 2022-01-15 DIAGNOSIS — G500 Trigeminal neuralgia: Secondary | ICD-10-CM

## 2022-01-15 DIAGNOSIS — R519 Headache, unspecified: Secondary | ICD-10-CM

## 2022-01-15 MED ORDER — GABAPENTIN 100 MG PO CAPS: 100.0000 mg | ORAL_CAPSULE | Freq: Three times a day (TID) | ORAL | 0 refills | Status: DC | PRN

## 2022-01-15 NOTE — Progress Notes (Signed)
Virtual Visit via Video Note  I connected with [name] on 01/15/22 at  3:30 PM EST by a video enabled telemedicine application 2/2 [id number] pandemic and verified that I am speaking with the correct person using two identifiers.  Location patient: home Location provider:work or home office Persons participating in the virtual visit: patient, provider  I discussed the limitations of evaluation and management by telemedicine and the availability of in person appointments. The patient expressed understanding and agreed to proceed.   HPI: Pt states around 1/7 or 1/8 she began having pain in inside of mouth.  Pain is electric like shock that last sec.  Pain in L side of face, shooting from L ear to jaw line and upper teeth.  Room temp water on L side of mouth causes pain. Thought was dental related, tried OTC orajel.  Had dental appt 1/12, exam and xray were unrevealing.   Referred to Orthodontist, seen on 1/16.  Dental providers concerned pain may be due to shingles.  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  Patient given prescription for acyclovir at time of positive blood work however has never had any cold sores or other lesions. Tried taking acyclovir but has not noticed improvement in symptoms.  ROS: See pertinent positives and negatives per HPI.  Past Medical History:  Diagnosis Date   Abnormal Pap smear 2004   Dyspareunia    History of fainting spells of unknown cause    Migraines    Palpitations    Pelvic pain in female    Post - coital bleeding    Post partum depression    Scoliosis    Yeast vaginitis    recurrent     Past Surgical History:  Procedure Laterality Date   CESAREAN SECTION     TUBAL LIGATION      Family History  Problem Relation Age of Onset   Heart disease Mother    Hypertension Mother    Osteoporosis Mother    Osteoporosis Sister    Turner syndrome Sister    Hypertension Maternal Uncle     Current Outpatient Medications:    Multiple Vitamins-Minerals (MULTIVITAMIN ADULT PO), multivitamin, Disp: , Rfl:    valACYclovir (VALTREX) 500 MG tablet, Take 500 mg by mouth daily., Disp: , Rfl:    norethindrone-ethinyl estradiol (LOESTRIN FE) 1-20 MG-MCG tablet, Take 1 tablet by mouth daily. (Patient not taking: Reported on 01/15/2022), Disp: , Rfl:    potassium chloride SA (KLOR-CON) 20 MEQ tablet, Take 1 tablet (20 mEq total) by mouth 2 (two) times daily for 3 days., Disp: 6 tablet, Rfl: 0  EXAM:  VITALS per patient if applicable: RR between 48-88 bpm  GENERAL: alert, oriented, appears well and in no acute distress  HEENT: atraumatic, conjunctiva clear, no obvious abnormalities on inspection of external nose and ears  NECK: normal movements of the head and neck  LUNGS: on inspection no signs of respiratory distress, breathing rate appears normal, no obvious gross SOB, gasping or wheezing  CV: no obvious cyanosis  Skin: No visible lesions on face, no erythema, no edema.  MS: moves all visible extremities without noticeable abnormality  PSYCH/NEURO: pleasant and cooperative, no obvious depression or anxiety, speech and thought processing grossly intact  ASSESSMENT AND PLAN:  Discussed the following assessment and plan:   Facial pain  Trigeminal neuralgia of left side of face -New problem -Given symptoms and negative dental work-up concern pain 2/2 trigeminal neuralgia. -Discussed  imaging to further evaluate -Discussed medication options for nerve pain including carbamazepine versus gabapentin.  We will start gabapentin as likely better tolerated.  Discussed r/b/a. -Given precautions - Plan: gabapentin (NEURONTIN) 100 MG capsule, MR FACE/TRIGEMINAL WO/W CM  Follow-up as needed   I discussed the assessment and treatment plan with the patient. The patient was provided an opportunity to ask questions and all were answered. The patient agreed with the plan and demonstrated  an understanding of the instructions.   The patient was advised to call back or seek an in-person evaluation if the symptoms worsen or if the condition fails to improve as anticipated.  I provided 16 minutes of non-face-to-face time during this encounter.   Billie Ruddy, MD

## 2022-01-18 ENCOUNTER — Telehealth: Payer: 59 | Admitting: Family Medicine

## 2022-02-04 ENCOUNTER — Ambulatory Visit
Admission: RE | Admit: 2022-02-04 | Discharge: 2022-02-04 | Disposition: A | Discharge status: Home / Self Care | Payer: 59 | Source: Ambulatory Visit | Attending: Family Medicine | Admitting: Family Medicine

## 2022-02-04 DIAGNOSIS — G500 Trigeminal neuralgia: Secondary | ICD-10-CM

## 2022-02-04 MED ORDER — GADOBENATE DIMEGLUMINE 529 MG/ML IV SOLN
13.0000 mL | Freq: Once | INTRAVENOUS | Status: AC | PRN
  Administered 2022-02-04: 13 mL via INTRAVENOUS

## 2022-06-09 ENCOUNTER — Other Ambulatory Visit: Payer: Self-pay | Admitting: Family Medicine

## 2022-06-09 DIAGNOSIS — G500 Trigeminal neuralgia: Secondary | ICD-10-CM

## 2022-06-11 ENCOUNTER — Encounter: Payer: 59 | Admitting: Family Medicine

## 2022-06-25 ENCOUNTER — Ambulatory Visit: Payer: 59 | Admitting: Family Medicine

## 2022-06-25 VITALS — BP 102/58 | HR 89 | Temp 98.8°F | Ht 63.0 in | Wt 143.4 lb

## 2022-06-25 DIAGNOSIS — R002 Palpitations: Secondary | ICD-10-CM

## 2022-06-25 DIAGNOSIS — F439 Reaction to severe stress, unspecified: Secondary | ICD-10-CM | POA: Diagnosis not present

## 2022-06-25 DIAGNOSIS — F419 Anxiety disorder, unspecified: Secondary | ICD-10-CM

## 2022-06-25 DIAGNOSIS — F458 Other somatoform disorders: Secondary | ICD-10-CM

## 2022-06-25 DIAGNOSIS — R519 Headache, unspecified: Secondary | ICD-10-CM

## 2022-06-25 MED ORDER — SERTRALINE HCL 25 MG PO TABS: 25.0000 mg | ORAL_TABLET | Freq: Every day | ORAL | 3 refills | Status: DC

## 2022-06-25 NOTE — Progress Notes (Signed)
Subjective:    Patient ID: [name], female    DOB: Jul 20, 1971, 51 y.o.   MRN: [id number]  Chief Complaint  Patient presents with   Palpitations   Anxiety    HPI Patient was seen today for ongoing concern.  Patient endorses increased palpitations and anxiety 2/2 stress dealing with her ex husband.  Patient can feel her heart skip beats which causes fatigue feeling.  Try to increase intake of water and Gatorade to see if that would help with the sensation.  Patient endorses facial pain as clenching teeth at night.  Increased stress is caused decrease in appetite.  Also eating less since mouth is sore.  Past Medical History:  Diagnosis Date   Abnormal Pap smear 2004   Dyspareunia    History of fainting spells of unknown cause    Migraines    Palpitations    Pelvic pain in female    Post - coital bleeding    Post partum depression    Scoliosis    Yeast vaginitis    recurrent     Allergies  Allergen Reactions   Hydrocodone Nausea Only and Other (See Comments)    dizziness   Tramadol Nausea And Vomiting and Other (See Comments)    dizziness    ROS General: Denies fever, chills, night sweats +changes in weight, changes in appetite HEENT: Denies headaches, ear pain, changes in vision, rhinorrhea, sore throat + facial pain, bruxism CV: Denies CP, SOB, orthopnea + palpitations Pulm: Denies SOB, cough, wheezing GI: Denies abdominal pain, nausea, vomiting, diarrhea, constipation GU: Denies dysuria, hematuria, frequency, vaginal discharge Msk: Denies muscle cramps, joint pains Neuro: Denies weakness, numbness, tingling Skin: Denies rashes, bruising Psych: Denies depression,  hallucinations   + stress, anxiety     Objective:    Blood pressure (!) 102/58, pulse 89, temperature 98.8 F (37.1 C), temperature source Oral, height '5\' 3"'$  (1.6 m), weight 143 lb 6.4 oz (65 kg), SpO2 98 %.  Gen. Pleasant, well-nourished, in no distress, normal affect   HEENT: [place]/AT, face  symmetric, conjunctiva clear, no scleral icterus, PERRLA, EOMI, nares patent without drainage Lungs: no accessory muscle use, CTAB, no wheezes or rales Cardiovascular: RRR, no m/r/g, no peripheral edema Musculoskeletal: No deformities, no cyanosis or clubbing, normal tone Neuro:  A&Ox3, CN II-XII intact, normal gait Skin:  Warm, no lesions/ rash   Wt Readings from Last 3 Encounters:  06/25/22 143 lb 6.4 oz (65 kg)  01/15/22 139 lb (63 kg)  10/15/21 143 lb 9.6 oz (65.1 kg)    Lab Results  Component Value Date   WBC 4.8 10/15/2021   HGB 12.8 10/15/2021   HCT 38.2 10/15/2021   PLT 352.0 10/15/2021   GLUCOSE 98 10/15/2021   CHOL 166 08/16/2019   TRIG 141.0 08/16/2019   HDL 40.80 08/16/2019   LDLDIRECT 74.4 08/29/2014   LDLCALC 97 08/16/2019   ALT 11 10/15/2021   AST 15 10/15/2021   NA 140 10/15/2021   K 3.2 (L) 10/15/2021   CL 104 10/15/2021   CREATININE 0.87 10/15/2021   BUN 10 10/15/2021   CO2 27 10/15/2021   TSH 3.58 08/16/2019   HGBA1C 5.6 08/16/2019      06/25/2022    3:19 PM  Depression screen PHQ 2/9  Decreased Interest 0  Down, Depressed, Hopeless 0  PHQ - 2 Score 0  Altered sleeping 2  Tired, decreased energy 1  Change in appetite 2  Feeling bad or failure about yourself  0  Trouble  concentrating 0  Moving slowly or fidgety/restless 0  Suicidal thoughts 0  PHQ-9 Score 5  Difficult doing work/chores Not difficult at all      06/25/2022    3:38 PM  GAD 7 : Generalized Anxiety Score  Nervous, Anxious, on Edge 3  Control/stop worrying 3  Worry too much - different things 3  Trouble relaxing 2  Restless 1  Easily annoyed or irritable 3  Afraid - awful might happen 3  Total GAD 7 Score 18  Anxiety Difficulty Not difficult at all    Assessment/Plan:  Anxiety  -GAD-7 score 18 this visit -PHQ-9 score 5 -Discussed various options including counseling and medications. -Patient agreeable to medication.  We will start Zoloft 25 mg daily. -Given  information on area counselors -Given strict precautions - Plan: sertraline (ZOLOFT) 25 MG tablet, TSH, T4, Free  Stress -Situational -Self-care encouraged  Palpitations  -No palpitations currently noted during exam -We will wait on EKG is less likely to show any abnormalities if not experiencing symptoms. -Discussed obtaining labs to evaluate for electrolyte abnormality, thyroid dysfunction, etc. -For continued or worsening symptoms obtain Holter monitor - Plan: CBC with Differential/Platelet, TSH, T4, Free, Basic metabolic panel, Vitamin B16, Folate  Bruxism -Discussed decreasing stress to decrease clenching of teeth at night -Consider mouthguard -Also follow-up with dentist -for continued symptoms consider muscle relaxer  Facial pain -Likely 2/2 bruxism and increased stress  - Plan: Vitamin B12, Folate  F/u in 4-6 weeks, sooner if needed  Grier Mitts, MD

## 2022-06-26 LAB — CBC WITH DIFFERENTIAL/PLATELET
Absolute Monocytes: 427 cells/uL (ref 200–950)
Basophils Absolute: 22 cells/uL (ref 0–200)
Basophils Relative: 0.5 %
Eosinophils Absolute: 79 cells/uL (ref 15–500)
Eosinophils Relative: 1.8 %
HCT: 36.7 % (ref 35.0–45.0)
Hemoglobin: 12.4 g/dL (ref 11.7–15.5)
Lymphs Abs: 1769 cells/uL (ref 850–3900)
MCH: 31.2 pg (ref 27.0–33.0)
MCHC: 33.8 g/dL (ref 32.0–36.0)
MCV: 92.4 fL (ref 80.0–100.0)
MPV: 9.7 fL (ref 7.5–12.5)
Monocytes Relative: 9.7 %
Neutro Abs: 2103 cells/uL (ref 1500–7800)
Neutrophils Relative %: 47.8 %
Platelets: 305 10*3/uL (ref 140–400)
RBC: 3.97 10*6/uL (ref 3.80–5.10)
RDW: 12.3 % (ref 11.0–15.0)
Total Lymphocyte: 40.2 %
WBC: 4.4 10*3/uL (ref 3.8–10.8)

## 2022-06-26 LAB — TSH: TSH: 1.41 mIU/L

## 2022-06-26 LAB — BASIC METABOLIC PANEL
BUN: 12 mg/dL (ref 7–25)
CO2: 27 mmol/L (ref 20–32)
Calcium: 9.8 mg/dL (ref 8.6–10.2)
Chloride: 104 mmol/L (ref 98–110)
Creat: 0.84 mg/dL (ref 0.50–0.99)
Glucose, Bld: 80 mg/dL (ref 65–99)
Potassium: 4.3 mmol/L (ref 3.5–5.3)
Sodium: 138 mmol/L (ref 135–146)

## 2022-06-26 LAB — FOLATE: Folate: 17.4 ng/mL

## 2022-06-26 LAB — VITAMIN B12: Vitamin B-12: 705 pg/mL (ref 200–1100)

## 2022-06-26 LAB — T4, FREE: Free T4: 0.9 ng/dL (ref 0.8–1.8)

## 2022-10-19 LAB — HM MAMMOGRAPHY

## 2022-11-22 ENCOUNTER — Encounter: Payer: Self-pay | Admitting: Family Medicine

## 2022-12-06 LAB — HM MAMMOGRAPHY

## 2022-12-09 ENCOUNTER — Encounter: Payer: 59 | Admitting: Family Medicine

## 2022-12-30 ENCOUNTER — Ambulatory Visit (INDEPENDENT_AMBULATORY_CARE_PROVIDER_SITE_OTHER): Payer: 59 | Admitting: Family Medicine

## 2022-12-30 ENCOUNTER — Encounter: Payer: Self-pay | Admitting: Family Medicine

## 2022-12-30 VITALS — BP 110/74 | HR 75 | Temp 98.4°F | Ht 62.75 in | Wt 141.6 lb

## 2022-12-30 DIAGNOSIS — Z136 Encounter for screening for cardiovascular disorders: Secondary | ICD-10-CM

## 2022-12-30 DIAGNOSIS — N926 Irregular menstruation, unspecified: Secondary | ICD-10-CM

## 2022-12-30 DIAGNOSIS — R0989 Other specified symptoms and signs involving the circulatory and respiratory systems: Secondary | ICD-10-CM | POA: Diagnosis not present

## 2022-12-30 DIAGNOSIS — Z0000 Encounter for general adult medical examination without abnormal findings: Secondary | ICD-10-CM | POA: Diagnosis not present

## 2022-12-30 DIAGNOSIS — Z23 Encounter for immunization: Secondary | ICD-10-CM

## 2022-12-30 DIAGNOSIS — F458 Other somatoform disorders: Secondary | ICD-10-CM | POA: Diagnosis not present

## 2022-12-30 LAB — CBC WITH DIFFERENTIAL/PLATELET
Basophils Absolute: 0 10*3/uL (ref 0.0–0.1)
Basophils Relative: 0.9 % (ref 0.0–3.0)
Eosinophils Absolute: 0.1 10*3/uL (ref 0.0–0.7)
Eosinophils Relative: 1.4 % (ref 0.0–5.0)
HCT: 39.5 % (ref 36.0–46.0)
Hemoglobin: 13.4 g/dL (ref 12.0–15.0)
Lymphocytes Relative: 49.5 % — ABNORMAL HIGH (ref 12.0–46.0)
Lymphs Abs: 1.8 10*3/uL (ref 0.7–4.0)
MCHC: 34 g/dL (ref 30.0–36.0)
MCV: 91.8 fl (ref 78.0–100.0)
Monocytes Absolute: 0.3 10*3/uL (ref 0.1–1.0)
Monocytes Relative: 9 % (ref 3.0–12.0)
Neutro Abs: 1.5 10*3/uL (ref 1.4–7.7)
Neutrophils Relative %: 39.2 % — ABNORMAL LOW (ref 43.0–77.0)
Platelets: 326 10*3/uL (ref 150.0–400.0)
RBC: 4.3 Mil/uL (ref 3.87–5.11)
RDW: 13.9 % (ref 11.5–15.5)
WBC: 3.7 10*3/uL — ABNORMAL LOW (ref 4.0–10.5)

## 2022-12-30 LAB — FOLLICLE STIMULATING HORMONE: FSH: 20.5 m[IU]/mL

## 2022-12-30 LAB — LIPID PANEL
Cholesterol: 169 mg/dL (ref 0–200)
HDL: 46.8 mg/dL (ref >39.00)
LDL Cholesterol: 97 mg/dL (ref 0–99)
NonHDL: 122.6
Total CHOL/HDL Ratio: 4
Triglycerides: 129 mg/dL (ref 0.0–149.0)
VLDL: 25.8 mg/dL (ref 0.0–40.0)

## 2022-12-30 LAB — COMPREHENSIVE METABOLIC PANEL
ALT: 15 U/L (ref 0–35)
AST: 16 U/L (ref 0–37)
Albumin: 4.6 g/dL (ref 3.5–5.2)
Alkaline Phosphatase: 81 U/L (ref 39–117)
BUN: 9 mg/dL (ref 6–23)
CO2: 28 mEq/L (ref 19–32)
Calcium: 9.8 mg/dL (ref 8.4–10.5)
Chloride: 103 mEq/L (ref 96–112)
Creatinine, Ser: 0.86 mg/dL (ref 0.40–1.20)
GFR: 79.02 mL/min (ref >60.00)
Glucose, Bld: 84 mg/dL (ref 70–99)
Potassium: 3.8 mEq/L (ref 3.5–5.1)
Sodium: 139 mEq/L (ref 135–145)
Total Bilirubin: 0.9 mg/dL (ref 0.2–1.2)
Total Protein: 7.5 g/dL (ref 6.0–8.3)

## 2022-12-30 LAB — HEMOGLOBIN A1C: Hgb A1c MFr Bld: 5.8 % (ref 4.6–6.5)

## 2022-12-30 LAB — T4, FREE: Free T4: 0.68 ng/dL (ref 0.60–1.60)

## 2022-12-30 LAB — TSH: TSH: 1.58 u[IU]/mL (ref 0.35–5.50)

## 2022-12-30 LAB — LUTEINIZING HORMONE: LH: 11.68 m[IU]/mL

## 2022-12-30 MED ORDER — CYCLOBENZAPRINE HCL 5 MG PO TABS: 5.0000 mg | ORAL_TABLET | Freq: Every evening | ORAL | 2 refills | Status: DC | PRN

## 2022-12-30 NOTE — Progress Notes (Signed)
Subjective:     [name] is a 52 y.o. female and is here for a comprehensive physical exam.  Patient states she is doing well.  Endorses less stress since last visit.  Had mammogram and ultrasound for breast cyst.  Had Pap and OB/GYN follow-up October 2023.  Notes improvement in bruxism and right dental/facial pain since last visit.  Occasionally uses muscle relaxer.  Requesting refill.  Endorses sinus issues as blowing nose in the morning occasionally causes right-sided twinge of dental pain/pulling sensation in sinus.  Denies thickened mucus.  Social History   Socioeconomic History   Marital status: Divorced    Spouse name: Not on file   Number of children: Not on file   Years of education: Not on file   Highest education level: Not on file  Occupational History   Not on file  Tobacco Use   Smoking status: Never   Smokeless tobacco: Never  Substance and Sexual Activity   Alcohol use: No   Drug use: No   Sexual activity: Not on file    Comment: BTL  Other Topics Concern   Not on file  Social History Narrative   Work or School: [employer], [place]: lives with husband and 3 kids (58, 24 and 19 yo in 2015)      Spiritual Beliefs: Christian      Lifestyle: no regular CV exercise; fair            Social Determinants of Health   Financial Resource Strain: Not on file  Food Insecurity: Not on file  Transportation Needs: Not on file  Physical Activity: Not on file  Stress: Not on file  Social Connections: Not on file  Intimate Partner Violence: Not on file   Health Maintenance  Topic Date Due   Hepatitis C Screening  Never done   PAP SMEAR-Modifier  05/19/2020   DTaP/Tdap/Td (2 - Td or Tdap) 05/11/2021   COLONOSCOPY (Pts 45-70yr Insurance coverage will need to be confirmed)  03/05/2032   INFLUENZA VACCINE  Completed   HIV Screening  Completed   HPV VACCINES  Aged Out    The following portions of the patient's history were reviewed  and updated as appropriate: allergies, current medications, past family history, past medical history, past social history, past surgical history, and problem list.  Review of Systems Pertinent items noted in HPI and remainder of comprehensive ROS otherwise negative.   Objective:    BP 110/74 (BP Location: Right Arm, Patient Position: Sitting, Cuff Size: Normal)   Pulse 75   Temp 98.4 F (36.9 C) (Oral)   Ht 5' 2.75" (1.594 m)   Wt 141 lb 9.6 oz (64.2 kg)   SpO2 100%   BMI 25.28 kg/m  General appearance: alert, cooperative, and no distress Head: Normocephalic, without obvious abnormality, atraumatic Eyes: conjunctivae/corneas clear. PERRL, EOM's intact. Fundi benign.  Allergic shiners. Ears: normal TM's and external ear canals both ears Nose: Nares normal. Septum midline. Mucosa normal. No drainage or sinus tenderness. Throat: lips, mucosa, and tongue normal; teeth and gums normal Neck: no adenopathy, no carotid bruit, no JVD, supple, symmetrical, trachea midline, and thyroid not enlarged, symmetric, no tenderness/mass/nodules Lungs: clear to auscultation bilaterally Heart: regular rate and rhythm, S1, S2 normal, no murmur, click, rub or gallop Abdomen: soft, non-tender; bowel sounds normal; no masses,  no organomegaly Extremities: extremities normal, atraumatic, no cyanosis or edema Pulses: 2+ and symmetric Skin: Skin color, texture, turgor normal.  No rashes or lesions Lymph nodes: Cervical, supraclavicular, and axillary nodes normal. Neurologic: Alert and oriented X 3, normal strength and tone. Normal symmetric reflexes. Normal coordination and gait    Assessment:    Healthy female exam.     Plan:    Anticipatory guidance given including wearing seatbelts, smoke detectors in the home, increasing physical activity, increasing p.o. intake of water and vegetables. -labs -immunizations reviewed.  Consider tdap,  -Mammogram done 2023 with u/s for cyst -pap done  10/19/22 -Colonoscopy done 03/05/2022 -Given handout -Next CPE in 1 year See After Visit Summary for Counseling Recommendations  Well adult exam - Plan: Hemoglobin A1c, Lipid panel, CMP  Irregular menses -Symptoms likely 2/2 perimenopause -  Plan: CBC with Differential/Platelet, TSH, T4, Free, Hemoglobin A1c, CMP, Luteinizing Hormone, Follicle Stimulating Hormone  Bruxism  - Plan: CBC with Differential/Platelet, cyclobenzaprine (FLEXERIL) 5 MG tablet  Need for Tdap vaccination -Tdap given this visit  Sinus complaint -Likely contributing to dental/facial discomfort. -Saline nasal rinse or Flonase nasal spray. -Consider local honey -Discussed antihistamine such as Claritin, Zyrtec, etc.  F/u prn  Grier Mitts, MD

## 2023-01-07 ENCOUNTER — Encounter: Payer: Self-pay | Admitting: Family Medicine

## 2023-01-07 ENCOUNTER — Telehealth (INDEPENDENT_AMBULATORY_CARE_PROVIDER_SITE_OTHER): Payer: 59 | Admitting: Family Medicine

## 2023-01-07 VITALS — HR 84 | Ht 62.75 in | Wt 141.0 lb

## 2023-01-07 DIAGNOSIS — U071 COVID-19: Secondary | ICD-10-CM | POA: Diagnosis not present

## 2023-01-07 MED ORDER — MOLNUPIRAVIR EUA 200MG CAPSULE: 4.0000 | ORAL_CAPSULE | Freq: Two times a day (BID) | ORAL | 0 refills | Status: AC

## 2023-01-07 NOTE — Progress Notes (Signed)
Virtual Visit via Video Note  I connected with [name] on 01/07/23 at  8:15 AM EST by a video enabled telemedicine application 2/2 [id number] pandemic and verified that I am speaking with the correct person using two identifiers.  Location patient: home Location provider:work or home office Persons participating in the virtual visit: patient, provider  I discussed the limitations of evaluation and management by telemedicine and the availability of in person appointments. The patient expressed understanding and agreed to proceed.  Chief Complaint  Patient presents with   Covid Positive    Tested positive for covid today, sinus congestion, headche, bodyache, nausea, chills, productive cough-yellow/green phlegm, fever last night of 100 and 102 the day before, and fatigue.sx started on Tuesday. Taking nightquil, aleve, tylenol, vitamins.      HPI: Pt is a 52 yo female with history of migraines and amenorrhea seen for acute concern. Pt developed chills and sore throat on Tuesday, 01/04/2023.  Symptoms progressed into productive cough, fever T max 102F, HA, sinus pressure, nausea, decreased appetite.  Patient tested positive COVID test yesterday 01/06/2023.  States symptoms were really bad yesterday.  Pt denies ear pain/pressure, diarrhea, SOB.  ROS: See pertinent positives and negatives per HPI.  Past Medical History:  Diagnosis Date   Abnormal Pap smear 2004   Dyspareunia    History of fainting spells of unknown cause    Migraines    Palpitations    Pelvic pain in female    Post - coital bleeding    Post partum depression    Scoliosis    Yeast vaginitis    recurrent     Past Surgical History:  Procedure Laterality Date   CESAREAN SECTION     TUBAL LIGATION      Family History  Problem Relation Age of Onset   Heart disease Mother    Hypertension Mother    Osteoporosis Mother    Osteoporosis Sister    Turner syndrome Sister    Hypertension Maternal Uncle     Current  Outpatient Medications:    cyclobenzaprine (FLEXERIL) 5 MG tablet, Take 1 tablet (5 mg total) by mouth at bedtime as needed for muscle spasms., Disp: 30 tablet, Rfl: 2   Magnesium 100 MG CAPS, Magnesium, Disp: , Rfl:    Multiple Vitamins-Minerals (MULTIVITAMIN ADULT PO), multivitamin, Disp: , Rfl:    Naproxen Sodium (ALEVE PO), As needed, Disp: , Rfl:    Potassium 95 MG TABS, Potassium, Disp: , Rfl:    valACYclovir (VALTREX) 500 MG tablet, Take 500 mg by mouth daily. As needed, Disp: , Rfl:    Acetaminophen (TYLENOL) 325 MG CAPS, 500 mg. As needed, Disp: , Rfl:    gabapentin (NEURONTIN) 100 MG capsule, Take 1 capsule by mouth three times daily as needed (Patient not taking: Reported on 01/07/2023), Disp: 90 capsule, Rfl: 0   sertraline (ZOLOFT) 25 MG tablet, Take 1 tablet (25 mg total) by mouth daily. (Patient not taking: Reported on 12/30/2022), Disp: 30 tablet, Rfl: 3  EXAM:  VITALS per patient if applicable: RR between 71-21 bpm  GENERAL: alert, oriented, appears well and in no acute distress  HEENT: atraumatic, conjunctiva clear, no obvious abnormalities on inspection of external nose and ears  NECK: normal movements of the head and neck  LUNGS: on inspection no signs of respiratory distress, breathing rate appears normal, no obvious gross SOB, gasping or wheezing  CV: no obvious cyanosis  MS: moves all visible extremities without noticeable abnormality  PSYCH/NEURO: pleasant and cooperative, no  obvious depression or anxiety, speech and thought processing grossly intact  ASSESSMENT AND PLAN:  Discussed the following assessment and plan:  COVID-19 virus infection - Plan: molnupiravir EUA (LAGEVRIO) 200 mg CAPS capsule  Positive at home COVID 19 test 01/06/2023 with symptoms starting 01/04/2023.  Discussed r/b/a of antiviral medications.  Continue supportive care.  Hydration encouraged.  Given strict precautions.  Note given for work.  Follow-up as needed  I discussed the  assessment and treatment plan with the patient. The patient was provided an opportunity to ask questions and all were answered. The patient agreed with the plan and demonstrated an understanding of the instructions.   The patient was advised to call back or seek an in-person evaluation if the symptoms worsen or if the condition fails to improve as anticipated.  Billie Ruddy, MD

## 2023-01-10 ENCOUNTER — Telehealth: Payer: Self-pay | Admitting: Family Medicine

## 2023-01-10 ENCOUNTER — Encounter: Payer: Self-pay | Admitting: Family Medicine

## 2023-01-10 NOTE — Telephone Encounter (Signed)
Patient requested a more detailed letter stating she was diagnosed with covid and how many days she should be out of work. Company has a covid protocol and they gives 5 days. Please load letter to [employer]

## 2023-01-11 ENCOUNTER — Telehealth: Payer: Self-pay | Admitting: Family Medicine

## 2023-01-11 NOTE — Telephone Encounter (Signed)
Patient needs a call back on Wednesday about her work note stating she can go back to work.

## 2023-01-11 NOTE — Telephone Encounter (Signed)
FYI  I used Covid-19 return to work note template and sent it to her thru [employer]. Pt states she will used that to bring it to her workplace. If need anything else, she will contact us.

## 2023-01-12 NOTE — Telephone Encounter (Signed)
Note was sent to pt mychart.

## 2023-01-19 NOTE — Telephone Encounter (Signed)
Additional note was given.

## 2023-01-24 ENCOUNTER — Ambulatory Visit (INDEPENDENT_AMBULATORY_CARE_PROVIDER_SITE_OTHER): Payer: 59 | Admitting: Family Medicine

## 2023-01-24 ENCOUNTER — Encounter: Payer: Self-pay | Admitting: Family Medicine

## 2023-01-24 VITALS — BP 90/66 | HR 96 | Temp 98.5°F | Ht 62.75 in | Wt 143.4 lb

## 2023-01-24 DIAGNOSIS — H6992 Unspecified Eustachian tube disorder, left ear: Secondary | ICD-10-CM

## 2023-01-24 NOTE — Progress Notes (Signed)
   Established Patient Office Visit   Subjective  Patient ID: [name], female    DOB: [birth year]  Age: 52 y.o. MRN: [id number]  Chief Complaint  Patient presents with   Ear Problem    Pt reports her L ear feels like " a bug in there". Sx started last wk.     Patient is a 52 year old female who presents for acute concern.  Patient endorses ear discomfort.  States left ear feels like there is a bug or something in there.  Has intermittent brief popping/dizzy noises in the left ear.  Denies ear pain, fever, chills, ear pressure.  Patient recently recovered from COVID-19 infection with the majority of her symptoms being upper respiratory in nature.      ROS Negative unless stated above    Objective:     BP 90/66 (BP Location: Right Arm, Patient Position: Sitting, Cuff Size: Large)   Pulse 96   Temp 98.5 F (36.9 C) (Oral)   Ht 5' 2.75" (1.594 m)   Wt 143 lb 6.4 oz (65 kg)   LMP 11/07/2022 (Approximate)   SpO2 98%   BMI 25.60 kg/m    Physical Exam Constitutional:      Appearance: Normal appearance.  HENT:     Head: Normocephalic and atraumatic.     Right Ear: Tympanic membrane normal.     Left Ear: Tympanic membrane normal.     Ears:     Comments: No cerumen in left canal, scant cerumen in right canal.  Bilateral TMs mildly full, no air-fluid level, erythema, or suppurative fluid.    Nose: Nose normal.  Eyes:     Conjunctiva/sclera: Conjunctivae normal.     Pupils: Pupils are equal, round, and reactive to light.  Pulmonary:     Effort: Pulmonary effort is normal.  Skin:    General: Skin is warm and dry.  Neurological:     Mental Status: She is alert and oriented to person, place, and time.      No results found for any visits on 01/24/23.    Assessment & Plan:  Dysfunction of left eustachian tube  Discussed symptoms likely 2/2 recent COVID-19 infection.  OTC antihistamines advised.  Given samples of Zyrtec in clinic.  Also consider using Flonase  nasal spray.  Follow-up for continued or worsening symptoms.  No follow-ups on file.   Billie Ruddy, MD

## 2023-11-04 ENCOUNTER — Other Ambulatory Visit: Payer: Self-pay | Admitting: Obstetrics and Gynecology

## 2023-11-04 DIAGNOSIS — Z1231 Encounter for screening mammogram for malignant neoplasm of breast: Secondary | ICD-10-CM

## 2023-11-09 ENCOUNTER — Other Ambulatory Visit: Payer: Self-pay | Admitting: Obstetrics and Gynecology

## 2023-11-09 DIAGNOSIS — N6489 Other specified disorders of breast: Secondary | ICD-10-CM

## 2023-11-10 ENCOUNTER — Ambulatory Visit
Admission: RE | Admit: 2023-11-10 | Discharge: 2023-11-10 | Disposition: A | Discharge status: Home / Self Care | Payer: 59 | Source: Ambulatory Visit | Attending: Obstetrics and Gynecology | Admitting: Obstetrics and Gynecology

## 2023-11-10 DIAGNOSIS — Z1231 Encounter for screening mammogram for malignant neoplasm of breast: Secondary | ICD-10-CM

## 2023-12-05 ENCOUNTER — Ambulatory Visit: Payer: 59

## 2023-12-28 ENCOUNTER — Encounter: Payer: Self-pay | Admitting: Family Medicine

## 2024-01-05 ENCOUNTER — Encounter: Payer: Self-pay | Admitting: Family Medicine

## 2024-01-05 ENCOUNTER — Ambulatory Visit: Payer: Self-pay | Admitting: Family Medicine

## 2024-01-05 VITALS — BP 100/68 | HR 92 | Temp 98.8°F | Ht 62.75 in | Wt 146.8 lb

## 2024-01-05 DIAGNOSIS — M25512 Pain in left shoulder: Secondary | ICD-10-CM | POA: Diagnosis not present

## 2024-01-05 DIAGNOSIS — Z0000 Encounter for general adult medical examination without abnormal findings: Secondary | ICD-10-CM

## 2024-01-05 DIAGNOSIS — F419 Anxiety disorder, unspecified: Secondary | ICD-10-CM

## 2024-01-05 DIAGNOSIS — M62838 Other muscle spasm: Secondary | ICD-10-CM

## 2024-01-05 LAB — CBC WITH DIFFERENTIAL/PLATELET
Basophils Absolute: 0 10*3/uL (ref 0.0–0.1)
Basophils Relative: 0.9 % (ref 0.0–3.0)
Eosinophils Absolute: 0 10*3/uL (ref 0.0–0.7)
Eosinophils Relative: 1.1 % (ref 0.0–5.0)
HCT: 38.6 % (ref 36.0–46.0)
Hemoglobin: 13 g/dL (ref 12.0–15.0)
Lymphocytes Relative: 47.8 % — ABNORMAL HIGH (ref 12.0–46.0)
Lymphs Abs: 1.8 10*3/uL (ref 0.7–4.0)
MCHC: 33.8 g/dL (ref 30.0–36.0)
MCV: 94.9 fL (ref 78.0–100.0)
Monocytes Absolute: 0.3 10*3/uL (ref 0.1–1.0)
Monocytes Relative: 8.6 % (ref 3.0–12.0)
Neutro Abs: 1.6 10*3/uL (ref 1.4–7.7)
Neutrophils Relative %: 41.6 % — ABNORMAL LOW (ref 43.0–77.0)
Platelets: 378 10*3/uL (ref 150.0–400.0)
RBC: 4.07 Mil/uL (ref 3.87–5.11)
RDW: 12.9 % (ref 11.5–15.5)
WBC: 3.7 10*3/uL — ABNORMAL LOW (ref 4.0–10.5)

## 2024-01-05 LAB — COMPREHENSIVE METABOLIC PANEL
ALT: 18 U/L (ref 0–35)
AST: 15 U/L (ref 0–37)
Albumin: 4.8 g/dL (ref 3.5–5.2)
Alkaline Phosphatase: 90 U/L (ref 39–117)
BUN: 11 mg/dL (ref 6–23)
CO2: 29 meq/L (ref 19–32)
Calcium: 9.8 mg/dL (ref 8.4–10.5)
Chloride: 104 meq/L (ref 96–112)
Creatinine, Ser: 0.86 mg/dL (ref 0.40–1.20)
GFR: 78.46 mL/min (ref >60.00)
Glucose, Bld: 85 mg/dL (ref 70–99)
Potassium: 4 meq/L (ref 3.5–5.1)
Sodium: 140 meq/L (ref 135–145)
Total Bilirubin: 0.7 mg/dL (ref 0.2–1.2)
Total Protein: 7.3 g/dL (ref 6.0–8.3)

## 2024-01-05 LAB — VITAMIN D 25 HYDROXY (VIT D DEFICIENCY, FRACTURES): VITD: 39.85 ng/mL (ref 30.00–100.00)

## 2024-01-05 LAB — LIPID PANEL
Cholesterol: 175 mg/dL (ref 0–200)
HDL: 50.5 mg/dL (ref >39.00)
LDL Cholesterol: 102 mg/dL — ABNORMAL HIGH (ref 0–99)
NonHDL: 124.54
Total CHOL/HDL Ratio: 3
Triglycerides: 113 mg/dL (ref 0.0–149.0)
VLDL: 22.6 mg/dL (ref 0.0–40.0)

## 2024-01-05 LAB — HEMOGLOBIN A1C: Hgb A1c MFr Bld: 6.1 % (ref 4.6–6.5)

## 2024-01-05 LAB — TSH: TSH: 1.94 u[IU]/mL (ref 0.35–5.50)

## 2024-01-05 MED ORDER — CYCLOBENZAPRINE HCL 5 MG PO TABS: 5.0000 mg | ORAL_TABLET | Freq: Every evening | ORAL | 3 refills | Status: AC | PRN

## 2024-01-05 NOTE — Progress Notes (Signed)
Established Patient Office Visit   Subjective  Patient ID: [name], female    DOB: 01/07/1971  Age: 53 y.o. MRN: [id number]  Chief Complaint  Patient presents with   Annual Exam    Pt is a 53 year old female seen for CPE.  Patient states she is doing much better since making several changes.  Patient no longer dealing with her divorce.  Also has a new job.  Patient notes left shoulder pain typically worse at the end of the day.  More of a nagging sensation.  Relieved with heat.  Tylenol nor ibuprofen helped.  Typically does not feel the discomfort with various movements.  Patient is a side sleeper.  Has more discomfort in left shoulder if laying on right side.  Patient notes arthritis runs in her family.  Patient requesting refill on Flexeril.  States no longer having that many muscle spasms and shoulders and back.  Prolonged sitting typically causes symptoms.  Likes to have medication available if needed.    Patient Active Problem List   Diagnosis Date Noted   Amenorrhea 08/29/2014   Migraine 08/29/2014   Past Medical History:  Diagnosis Date   Abnormal Pap smear 2004   Anxiety    Arthritis    Dyspareunia    History of fainting spells of unknown cause    Migraines    Palpitations    Pelvic pain in female    Post - coital bleeding    Post partum depression    Scoliosis    Yeast vaginitis    recurrent    Past Surgical History:  Procedure Laterality Date   CESAREAN SECTION     COSMETIC SURGERY  09/2019   Tummy tuck   TUBAL LIGATION     Social History   Tobacco Use   Smoking status: Never   Smokeless tobacco: Never  Substance Use Topics   Alcohol use: No   Drug use: No   Family History  Problem Relation Age of Onset   Heart disease Mother    Hypertension Mother    Osteoporosis Mother    Hearing loss Mother    Osteoporosis Sister    Turner syndrome Sister    Hypertension Maternal Uncle    Allergies  Allergen Reactions   Hydrocodone Nausea Only  and Other (See Comments)    dizziness   Tramadol Nausea And Vomiting and Other (See Comments)    dizziness      ROS Negative unless stated above    Objective:     There were no vitals taken for this visit. BP Readings from Last 3 Encounters:  01/24/23 90/66  12/30/22 110/74  06/25/22 (!) 102/58   Wt Readings from Last 3 Encounters:  01/24/23 143 lb 6.4 oz (65 kg)  01/07/23 141 lb (64 kg)  12/30/22 141 lb 9.6 oz (64.2 kg)      Physical Exam Constitutional:      Appearance: Normal appearance.  HENT:     Head: Normocephalic and atraumatic.     Right Ear: Tympanic membrane, ear canal and external ear normal.     Left Ear: Tympanic membrane, ear canal and external ear normal.     Nose: Nose normal.     Mouth/Throat:     Mouth: Mucous membranes are moist.     Pharynx: No oropharyngeal exudate or posterior oropharyngeal erythema.  Eyes:     General: No scleral icterus.    Extraocular Movements: Extraocular movements intact.     Conjunctiva/sclera: Conjunctivae normal.  Pupils: Pupils are equal, round, and reactive to light.  Neck:     Thyroid: No thyromegaly.  Cardiovascular:     Rate and Rhythm: Normal rate and regular rhythm.     Pulses: Normal pulses.     Heart sounds: Normal heart sounds. No murmur heard.    No friction rub.  Pulmonary:     Effort: Pulmonary effort is normal.     Breath sounds: Normal breath sounds. No wheezing, rhonchi or rales.  Abdominal:     General: Bowel sounds are normal.     Palpations: Abdomen is soft.     Tenderness: There is no abdominal tenderness.  Musculoskeletal:        General: No deformity. Normal range of motion.     Right shoulder: Normal.     Left shoulder: Bony tenderness present.     Comments: L shoulder FROM active and passive.  No deformity, crepitus, impingement.  TTP over left AC joint.  Lymphadenopathy:     Cervical: No cervical adenopathy.  Skin:    General: Skin is warm and dry.     Findings: No lesion.   Neurological:     General: No focal deficit present.     Mental Status: She is alert and oriented to person, place, and time.  Psychiatric:        Mood and Affect: Mood normal.        Thought Content: Thought content normal.      No results found for any visits on 01/05/24.    Assessment & Plan:  Well adult exam -Age-appropriate health screenings discussed -Obtain labs -Immunizations reviewed and up-to-date.  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  Also consider bursitis. -Discussed supportive care including continuing heat.  Consider topical analgesics if needed. -Declines imaging at this time.  Muscle spasms of shoulder region -Stable -Discussed stretching, ergonomic workspace changes, taking breaks from prolonged sitting to help reduce symptoms.        -      Flexeril 5 mg nightly as needed  No follow-ups on file.   Deeann Saint, MD

## 2024-01-24 ENCOUNTER — Encounter: Payer: Self-pay | Admitting: Family Medicine
